# Patient Record
Sex: Female | Born: 1955 | Race: White | Hispanic: No | Marital: Single
Health system: Southern US, Community
[De-identification: ages and names within clinical notes are randomized; demographics above are authoritative.]

## PROBLEM LIST (undated history)

## (undated) DIAGNOSIS — K449 Diaphragmatic hernia without obstruction or gangrene: Secondary | ICD-10-CM

## (undated) DIAGNOSIS — K219 Gastro-esophageal reflux disease without esophagitis: Secondary | ICD-10-CM

## (undated) DIAGNOSIS — I10 Essential (primary) hypertension: Secondary | ICD-10-CM

## (undated) DIAGNOSIS — K589 Irritable bowel syndrome without diarrhea: Secondary | ICD-10-CM

---

## 1998-12-26 ENCOUNTER — Ambulatory Visit (HOSPITAL_COMMUNITY): Admission: RE | Admit: 1998-12-26 | Discharge: 1998-12-26 | Payer: Self-pay | Admitting: Family Medicine

## 1998-12-26 ENCOUNTER — Encounter: Payer: Self-pay | Admitting: Family Medicine

## 2003-07-01 ENCOUNTER — Ambulatory Visit (HOSPITAL_COMMUNITY): Admission: RE | Admit: 2003-07-01 | Discharge: 2003-07-01 | Payer: Self-pay | Admitting: Family Medicine

## 2003-07-01 ENCOUNTER — Encounter: Payer: Self-pay | Admitting: Family Medicine

## 2003-07-06 ENCOUNTER — Other Ambulatory Visit: Admission: RE | Admit: 2003-07-06 | Discharge: 2003-07-06 | Payer: Self-pay | Admitting: Obstetrics and Gynecology

## 2003-08-04 ENCOUNTER — Observation Stay (HOSPITAL_COMMUNITY): Admission: AD | Admit: 2003-08-04 | Discharge: 2003-08-04 | Payer: Self-pay | Admitting: Obstetrics and Gynecology

## 2003-08-04 ENCOUNTER — Encounter (INDEPENDENT_AMBULATORY_CARE_PROVIDER_SITE_OTHER): Payer: Self-pay

## 2004-06-25 ENCOUNTER — Encounter (INDEPENDENT_AMBULATORY_CARE_PROVIDER_SITE_OTHER): Payer: Self-pay | Admitting: Specialist

## 2004-06-25 ENCOUNTER — Ambulatory Visit (HOSPITAL_COMMUNITY): Admission: RE | Admit: 2004-06-25 | Discharge: 2004-06-25 | Payer: Self-pay | Admitting: Gastroenterology

## 2004-08-04 ENCOUNTER — Emergency Department (HOSPITAL_COMMUNITY): Admission: EM | Admit: 2004-08-04 | Discharge: 2004-08-04 | Payer: Self-pay | Admitting: Emergency Medicine

## 2004-12-19 ENCOUNTER — Other Ambulatory Visit: Admission: RE | Admit: 2004-12-19 | Discharge: 2004-12-19 | Payer: Self-pay | Admitting: Obstetrics and Gynecology

## 2006-09-10 ENCOUNTER — Other Ambulatory Visit: Admission: RE | Admit: 2006-09-10 | Discharge: 2006-09-10 | Payer: Self-pay | Admitting: Obstetrics and Gynecology

## 2009-03-21 ENCOUNTER — Inpatient Hospital Stay (HOSPITAL_COMMUNITY): Admission: EM | Admit: 2009-03-21 | Discharge: 2009-03-24 | Payer: Self-pay | Admitting: Emergency Medicine

## 2011-04-11 LAB — CLOSTRIDIUM DIFFICILE EIA
C difficile Toxins A+B, EIA: NEGATIVE
C difficile Toxins A+B, EIA: NEGATIVE

## 2011-04-11 LAB — BASIC METABOLIC PANEL
BUN: 10 mg/dL (ref 6–23)
BUN: 6 mg/dL (ref 6–23)
BUN: 9 mg/dL (ref 6–23)
Calcium: 8.1 mg/dL — ABNORMAL LOW (ref 8.4–10.5)
Calcium: 8.3 mg/dL — ABNORMAL LOW (ref 8.4–10.5)
Creatinine, Ser: 1.42 mg/dL — ABNORMAL HIGH (ref 0.4–1.2)
GFR calc Af Amer: 49 mL/min — ABNORMAL LOW (ref >60)
GFR calc non Af Amer: 39 mL/min — ABNORMAL LOW (ref >60)
GFR calc non Af Amer: 41 mL/min — ABNORMAL LOW (ref >60)
GFR calc non Af Amer: 44 mL/min — ABNORMAL LOW (ref >60)
Glucose, Bld: 92 mg/dL (ref 70–99)
Glucose, Bld: 92 mg/dL (ref 70–99)
Potassium: 3.6 mEq/L (ref 3.5–5.1)
Sodium: 137 mEq/L (ref 135–145)
Sodium: 142 mEq/L (ref 135–145)

## 2011-04-11 LAB — FECAL LACTOFERRIN, QUANT: Fecal Lactoferrin: POSITIVE

## 2011-04-11 LAB — COMPREHENSIVE METABOLIC PANEL
ALT: 23 U/L (ref 0–35)
AST: 19 U/L (ref 0–37)
Albumin: 2.4 g/dL — ABNORMAL LOW (ref 3.5–5.2)
Alkaline Phosphatase: 59 U/L (ref 39–117)
BUN: 17 mg/dL (ref 6–23)
CO2: 24 mEq/L (ref 19–32)
Calcium: 7.8 mg/dL — ABNORMAL LOW (ref 8.4–10.5)
Calcium: 8.6 mg/dL (ref 8.4–10.5)
GFR calc Af Amer: 32 mL/min — ABNORMAL LOW (ref >60)
GFR calc non Af Amer: 26 mL/min — ABNORMAL LOW (ref >60)
Potassium: 3.6 mEq/L (ref 3.5–5.1)
Sodium: 132 mEq/L — ABNORMAL LOW (ref 135–145)
Sodium: 137 mEq/L (ref 135–145)
Total Protein: 5.5 g/dL — ABNORMAL LOW (ref 6.0–8.3)

## 2011-04-11 LAB — PROTIME-INR: INR: 1.1 (ref 0.00–1.49)

## 2011-04-11 LAB — URINE MICROSCOPIC-ADD ON

## 2011-04-11 LAB — IRON AND TIBC
Iron: 77 ug/dL (ref 42–135)
TIBC: 292 ug/dL (ref 250–470)

## 2011-04-11 LAB — HEPATIC FUNCTION PANEL
ALT: 15 U/L (ref 0–35)
AST: 14 U/L (ref 0–37)
Albumin: 2.2 g/dL — ABNORMAL LOW (ref 3.5–5.2)
Alkaline Phosphatase: 59 U/L (ref 39–117)
Bilirubin, Direct: 0.1 mg/dL (ref 0.0–0.3)
Total Bilirubin: 0.5 mg/dL (ref 0.3–1.2)

## 2011-04-11 LAB — DIFFERENTIAL
Eosinophils Absolute: 0.3 10*3/uL (ref 0.0–0.7)
Eosinophils Relative: 3 % (ref 0–5)
Lymphs Abs: 2.8 10*3/uL (ref 0.7–4.0)
Monocytes Relative: 6 % (ref 3–12)

## 2011-04-11 LAB — RETICULOCYTES: Retic Count, Absolute: 18.7 10*3/uL — ABNORMAL LOW (ref 19.0–186.0)

## 2011-04-11 LAB — LIPASE, BLOOD: Lipase: 27 U/L (ref 11–59)

## 2011-04-11 LAB — POCT I-STAT, CHEM 8
Calcium, Ion: 0.95 mmol/L — ABNORMAL LOW (ref 1.12–1.32)
Glucose, Bld: 81 mg/dL (ref 70–99)
HCT: 31 % — ABNORMAL LOW (ref 36.0–46.0)
Hemoglobin: 10.5 g/dL — ABNORMAL LOW (ref 12.0–15.0)

## 2011-04-11 LAB — CBC
HCT: 28.1 % — ABNORMAL LOW (ref 36.0–46.0)
HCT: 28.4 % — ABNORMAL LOW (ref 36.0–46.0)
HCT: 29.9 % — ABNORMAL LOW (ref 36.0–46.0)
MCHC: 33.2 g/dL (ref 30.0–36.0)
MCHC: 33.4 g/dL (ref 30.0–36.0)
Platelets: 197 10*3/uL (ref 150–400)
Platelets: 202 10*3/uL (ref 150–400)
Platelets: 232 10*3/uL (ref 150–400)
RBC: 3.46 MIL/uL — ABNORMAL LOW (ref 3.87–5.11)
RBC: 4.34 MIL/uL (ref 3.87–5.11)
RDW: 15.6 % — ABNORMAL HIGH (ref 11.5–15.5)
RDW: 15.7 % — ABNORMAL HIGH (ref 11.5–15.5)
WBC: 11.4 10*3/uL — ABNORMAL HIGH (ref 4.0–10.5)
WBC: 5.2 10*3/uL (ref 4.0–10.5)

## 2011-04-11 LAB — URINALYSIS, ROUTINE W REFLEX MICROSCOPIC
Bilirubin Urine: NEGATIVE
Glucose, UA: NEGATIVE mg/dL
Ketones, ur: NEGATIVE mg/dL
pH: 6 (ref 5.0–8.0)

## 2011-04-11 LAB — FERRITIN: Ferritin: 51 ng/mL (ref 10–291)

## 2011-04-11 LAB — HEMOCCULT GUIAC POC 1CARD (OFFICE): Fecal Occult Bld: NEGATIVE

## 2011-04-11 LAB — URINE CULTURE: Colony Count: 10000

## 2011-04-11 LAB — TSH: TSH: 2.478 u[IU]/mL (ref 0.350–4.500)

## 2011-04-11 LAB — VITAMIN B12: Vitamin B-12: 311 pg/mL (ref 211–911)

## 2011-04-11 LAB — HEMOGLOBIN A1C: Mean Plasma Glucose: 137 mg/dL

## 2011-05-14 NOTE — Discharge Summary (Signed)
[name], [name]              ACCOUNT NO.:  [id number]   MEDICAL RECORD NO.:  [id number]          PATIENT TYPE:  INP   LOCATION:  1540                         FACILITY:  [facility]   PHYSICIAN:  Ramiro Harvest, MD    DATE OF BIRTH:  [birth year]   DATE OF ADMISSION:  03/20/2009  DATE OF DISCHARGE:  03/24/2009                               DISCHARGE SUMMARY   PRIMARY CARE PHYSICIAN:  Emeterio Reeve, MD   DISCHARGE DIAGNOSES:  1. acute gastroenteritis.  2. Acute renal failure.  3. Orthostasis.  4. Dehydration.  5. Anemia of chronic disease.  6. Irritable bowel syndrome.  7. Hypertension.  8. Allergic rhinitis.  9. Gastroesophageal reflux disease slash hiatal hernia.  10.Hepatic steatosis.  11.Hypokalemia.   DISCHARGE MEDICATIONS:  1. Nexium 40 mg p.o. daily  2. Singular 10 mg p.o. q.h.s.  3. Robinul forte 2 mg b.i.d.  4. Omnaris nasal spray 50 mcg as needed.  5. Xyzal 5 mg p.o. q.h.s.  6. Promethazine 25 mg p.o. t.i.d. p.r.n.  7. Ocella 3/0.03 mg p.o. daily.  8. Librax 1 tablet p.o. q.6-8 hours as needed.  9. Astepro 2 nasally as needed as previously taken.  10.Lisinopril HCTZ 20/25 mg p.o. daily, the patient is to start this 4      days post discharge.  11.Multivitamin 1 tablet daily.   DISPOSITION AND FOLLOW-UP:  The patient will be discharged home.  The  patient is to follow up with PCP in the next 1-2 weeks.  On follow-up a  basic metabolic profile needs to be checked to follow up on the  patient's renal function and electrolytes.  The patient also need a CBC  checked to follow up on her hemoglobin.   CONSULTATIONS DONE:  None.   PROCEDURES PERFORMED:  A CT of the abdomen and pelvis were performed on  March 20, 2009 which showed a small hiatal hernia, ill-defined, 2.5 cm  liver lesion in the setting of mild diffuse fatty infiltration,  scattered sigmoid diverticula without CT evidence of diverticulitis or  abscess.  Chest x-ray was done on March 21, 2009 that  showed no acute  disease.  An MRI of the abdomen was done on March 23, 2009; that showed  heterogeneous hepatic steatosis, focal fact superior to the gallbladder  accounts for the lesion demonstrated on prior CT.  There are no  suspicious liver lesions, diffusely decreased heterogeneous enhancement  of the right kidney with respect to the left.  Findings are most  compatible with pyelonephritis.  Correlation with urinalysis is  recommended.  No renal artery or vein occlusion is identified and there  is no hydronephrosis.   BRIEF ADMISSION HISTORY AND PHYSICAL:  [name] is a 55-year-  old female with a history allergic rhinitis and irritable bowel  syndrome.  The patient was at her baseline of health up until 3 days  prior to admission when she developed abdominal pains as well as severe  suprapubic abdominal.  Pain prior to this she was thought to have a  urinary tract infection and was originally started on ciprofloxacin.  For about a  week prior to admission the patient had been having  intermittent nausea and vomiting as well as diarrhea.  The patient  thinks the diarrhea first started before onset of the ciprofloxacin, but  got worse after initiation of the Cipro.  The patient nonetheless  completed the course with the help of some Phenergan, but continued not  to feel well. On the day of admission she presented to her PCP, Dr.  Laurine Blazer, for a checkup.  A CBC was obtained which showed an elevated  white count up to 12.7 at which point she was directly admitted.  She  was directed to the ED department to obtain a CT scan of her abdomen.  Electrolytes were drawn then and noted to have an elevated creatinine of  2.0.  On review of her records last creatinine in 2008 was 0.8.  The  patient denied any history of renal disease, but per family and herself  has had a hard time eating and drinking for the past few days.  The  patient had not eaten anything at all on the day of  admission and had  been having a lot of diarrhea.  The patient describes her irritable  bowel syndrome as sometimes occasional bouts of diarrhea and also  sometimes associated with nausea, so she thought it was all related to  this.  Patient did see Dr. Evette Cristal, who is a gastroenterologist, for her  irritable bowel syndrome who recommended for her to continue to take her  Robinul forte, but thought there might be something else going on given  the extent of her symptoms.   PHYSICAL EXAMINATION:  Per admitting physician, temperature 97.4, blood  pressure 111/56, pulse of 95, respirations 16, satting 99% on room air.  GENERAL:  Patient no acute distress, slightly obese female.  HEENT: Normocephalic, atraumatic.  Pupils equal, round and reactive to  light and accommodation.  Extraocular movements intact.  NECK:  Supple with no lymphadenopathy.  Dry mucous membranes, decreased  skin turgor.  RESPIRATORY:  Occasional mild crackles throughout but overall good air  movement.  No wheezes noted.  CARDIOVASCULAR:  Regular rate and rhythm.  No murmurs, rubs or gallops.  ABDOMEN:  Slightly obese, nontender, nondistended, positive bowel  sounds.  EXTREMITIES:  No clubbing, cyanosis or edema.  SKIN:  Clean, dry and intact.  NEUROLOGIC:  The patient was intact.  Patient was orthostatic.   ADMISSION LABS:  CBC:  White count 11.4, hemoglobin 10.5.  Sodium 135,  potassium 3.2, creatinine of 2.0, albumin of 2.9.  UA was showing 3-6  white blood cells, no bacteria, no nitrites, lipase was 27.  LFTs were  within normal limits except of albumin.  CT scan of the abdomen and  pelvis as stated above.   HOSPITAL COURSE:  1. Acute gastroenteritis.  The patient was admitted.  It was felt the      patient did have an acute gastroenteritis.  Stool cultures were      checked which were essentially and negative by the day of      admission.  C. diff cultures were also obtained which were negative      x2.  The  patient was placed on supportive care with IV fluids and      pain management.  CT scan was obtained as stated above.  CT scan      was concerning for possible liver lesions.  An MRI was obtained      which was essentially negative.  The patient  improved during the      hospitalization with improvement of her symptoms.  The patient's      nausea and vomiting had resolved by day of discharge.  The      patient's stools had been more formed and the patient was no longer      having any diarrhea.  The patient will be discharged home in stable      and improved condition.  The patient's white count also normalized      by day of discharge.  2. Acute renal failure.  The patient was noted to be in acute renal      failure on admission.  This was felt to be secondary to a prerenal      azotemia.  The patient was hydrated with IV fluids with improvement      of her renal function on a daily basis.  By day of discharge the      patient's creatinine was down to 1.29.  The patient will need to      follow up with PCP in 1-2 weeks.  Will get a BMET checked again to      follow up on her electrolytes and renal function.  The patient will      be discharged in stable and improved condition.  3. Hypokalemia.  The patient was noted to be hypokalemic on admission.      The patient's potassium was repleted and had resolved by day of      discharge.  4. Dehydration.  The patient was noted to be dehydrated on admission.      The patient was hydrated with IV fluids and the patient was      euvolemic by day of discharge.  5. Orthostasis.  The patient was noted to be orthostatic on admission      and during the hospitalization the patient was hydrated with IV      fluids.  The patient was asymptomatic by day of discharge and      patient will be discharged in stable and improved condition with      resolution of her orthostasis.  6. The rest of the patient's chronic medical issues were stable      throughout  the hospitalization. The patient will be discharged in      stable and improved condition.   On the day of discharge vital signs:  Temperature 98.7, blood pressure  122/78, pulse of 76, respirations 20, satting 100% on room air.   DISCHARGE LABS:  Sodium 142, potassium 3.8, chloride 116, bicarb 20, BUN  6, creatinine 1.29, glucose of 92 and a calcium of 8.3.  CBC with a  white count of 5.2, hemoglobin of 9.4, platelets of 202, hematocrit of  28.4.   It was a pleasure taking care of [name].      Ramiro Harvest, MD  Electronically Signed     DT/MEDQ  D:  03/24/2009  T:  03/24/2009  Job:  [id number]   cc:   Emeterio Reeve, MD  Fax: [phone]

## 2011-05-14 NOTE — H&P (Signed)
NAME:  [name], [name]              ACCOUNT NO.:  [id number]   MEDICAL RECORD NO.:  [id number]          PATIENT TYPE:  INP   LOCATION:  1540                         FACILITY:  [facility]   PHYSICIAN:  Michiel Cowboy, MDDATE OF BIRTH:  04/29/1956   DATE OF ADMISSION:  03/20/2009  DATE OF DISCHARGE:                              HISTORY & PHYSICAL   PRIMARY CARE Javari Bufkin:  Dr. Emeterio Reeve.   The patient is a 55 year old female with history of acute rhinitis and  irritable bowel syndrome.  The patient was at her baseline of health up  until about 3 days ago or so when she developed abdominal pains, as well  as severe suprapubic abdominal pain.  Prior to this, she was thought to  have a urinary tract infection was started ciprofloxacin.  For about a  week now or so also she has been having intermittent nausea and  vomiting, as well as diarrhea.  She thinks the diarrhea started first  before the Cipro, but then got worse after the initiation of Cipro.  The  patient nonetheless completed the course with the help of with some  Phenergan, but continued to not feel well.  Today, she presented to Dr.  Paulino Rily for a checkup.  Her CBC was obtained and showed an elevated  white blood cell count up to 12.7, at which point she was directed to  the emergency department to obtain a CT scan of her abdomen.  Here, her  electrolytes were drawn and were noted to have an elevated creatinine at  2.0.  In review of records, her last creatinine was in 2008, and was  0.8.  She herself denies any history of renal disease.  But per her  family and herself had a hard time eating and drinking for the past few  days, has not eaten anything at all today and has been having a lot of  diarrhea.   She describes her irritable bowel syndrome at sometimes occasional bouts  of diarrhea and also sometimes associated with nausea, so she thought it  was all related to this.  She did see Dr. Evette Cristal, who is her GI  physician  for her irritable bowel syndrome who recommended for her to continue to  take her Robinul Forte, but thought there was maybe something else going  on given the extent of her symptoms.   PAST MEDICAL HISTORY SIGNIFICANT:  1. History of irritable bowel syndrome.  2. Hypertension.  3. Allergic rhinitis.  4. GERD.  5. Hiatal hernia.   SOCIAL HISTORY:  The patient does not smoke, does not use drugs, does  not drink alcohol.  Lives at home with supportive family members.   FAMILY HISTORY:  Noncontributory.   ALLERGIES:  AMOXICILLIN and ELEMENTAL MERCURY.  The patient develops a  rash if she comes in contact with it.   MEDICATIONS:  1. Lisinopril 20/25 mg daily.  2. Nexium 40 mg daily.  3. Singulair 10 mg daily.  4. Robinul Forte 2 mg twice a day.  5. __________2.5 mg in the evening.  6. __________ 4 puffs each nostril once a  day.  7. Astelin 2 puffs each nostril twice a day.  8. Yasmin.  9. Multivitamins.  10.The patient was also supposedly given acetaminophen/codeine 1      tablet as needed every 4 hours for pain.   REVIEW OF SYSTEMS:  Significant for patient having generalized weakness  and at first had some mild shortness of breath which currently resolved,  which she more attributes to just kind of feeling tired and not well  overall.  No chest pain.  No fevers or chills.  No constipation, but  severe diarrhea.  Her last bowel movement was in the morning.  Otherwise, review of systems unremarkable, except for also a mild cough.   PHYSICAL EXAMINATION:  VITAL SIGNS:  Temperature 97.4, blood pressure  111/56, pulse 95, respirations 16.  Saturating 99% on room air.  GENERAL:  The patient appears to be in no acute distress, slightly obese  female.  HEENT:  Head nontraumatic.  Dry mucous membranes.  Decreased skin  turgor.  LUNGS:  Occasional mild crackles throughout, but overall good air  movement.  No wheezes noted.  HEART:  Regular rate and rhythm.  No  murmurs,  rubs or gallops.  SKIN:  Clean, dry and intact.  ABDOMEN:  Slightly obese, but nontender, nondistended.  LOWER EXTREMITIES:  Without clubbing, cyanosis or edema.  NEUROLOGIC:  Intact.  The patient is orthostatic.   LABORATORY DATA:  White blood cell count 11.4, hemoglobin 10.5, sodium  135, potassium 3.2, creatinine 2.0, albumin 2.9.  UA showing 3-6 white  blood cells, but no bacteria and no nitrites, lipase 27.  LFTs within  normal limits with exception of albumin.  CT scan of her abdomen and  pelvis showing a 2.5 cm liver lesion which can be characterized because  it is a non-contrast CT scan, fatty liver infiltration and sigmoid  diverticulosis, but no diverticulitis or inflammation, otherwise  unremarkable.   ASSESSMENT/PLAN:  This is a 55 year old female who presents with slight  dehydration, acute renal failure, diarrhea and nausea.   1. Acute renal failure, likely secondary to dehydration.  CT scan of      the abdomen and pelvis did not show any hydroureter or renal      obstruction.  We will give IV fluids for orthostatics.  Follow      creatinine.  Once the patient's creatinine improves and she stops      being orthostatic, expect discharge to home.  2. Nausea/diarrhea.  Differential includes gastroenteritis versus part      of irritable bowel syndrome.  This is given diarrhea and recent      antibiotic exposure.  C diff could not be completely excluded.  We      will obtain stool studies.  Give supportive therapy for now.  3. Low potassium.  Will replace.  4. Elevated white blood cell count.  This could be related to      diarrhea.  It could be also hemoconcentration.  Also given mild      cough, will obtain a chest x-ray to rule out an infectious process.  5. Irritable bowel syndrome.  Continue Robinul for now.  6. Allergies.  Continue Singulair.  7. Liver lesion.  There are a few approaches to this.  She may either      have a contrast CT scan once her renal function  stabilizes and      improves or will need an MRI to further evaluate this.  This could  be done as an      outpatient.  I doubt that this has much to do with her current      diagnosis.  8. Hypertension.  Hold lisinopril and hydrochlorothiazide.  9. Prophylaxis.  Protonix and Lovenox.      Michiel Cowboy, MD  Electronically Signed     AVD/MEDQ  D:  03/21/2009  T:  03/21/2009  Job:  [id number]   cc:   Emeterio Reeve, MD  Fax: [phone]

## 2011-05-17 NOTE — Op Note (Signed)
NAME:  [name], [name]                        ACCOUNT NO.:  [id number]   MEDICAL RECORD NO.:  [id number]                   PATIENT TYPE:  AMB   LOCATION:  ENDO                                 FACILITY:  [facility]   PHYSICIAN:  Graylin Shiver, M.D.                DATE OF BIRTH:  1956-06-02   DATE OF PROCEDURE:  06/25/2004  DATE OF DISCHARGE:                                 OPERATIVE REPORT   PROCEDURE:  Esophagogastroduodenoscopy with biopsy.   INDICATIONS FOR PROCEDURE:  This patient has a history of abdominal pain and  history of acid reflux.  Informed consent was obtained after explanation of  the risks of bleeding, infection and perforation.   PREMEDICATIONS:  Fentanyl 75 mcg IV, Versed 8 mg IV.   DESCRIPTION OF PROCEDURE:  With the patient in the left lateral decubitus  position, the Olympus gastroscope was inserted into the oropharynx and  passed into the esophagus.  It was advanced down the esophagus and into the  stomach and into the duodenum.  The second portion and bulb of the duodenum  looked normal.  The stomach had normal-appearing color to the mucosa. There  were, however, some scattered small polyps in the body of the stomach on  multiple biopsies of these small polyps were obtained for histological  inspection.  I suspect these are benign hypoplastic or cystic-type polyps.  No lesions were seen on the fundus or cardia on retroflexion.  A biopsy was  also obtained from the distal stomach for a CLO-test to look for evidence of  Helicobacter pylori.  The esophagus was then inspected.  The esophagogastric  junction was at 35 cm.  There was a small hiatal hernia.  No abnormalities  were noted in the esophagus.  She tolerated the procedure well without  complications.   IMPRESSION:  1. Scattered small gastric polyps.  Biopsies were obtained.  2. Small hiatal hernia.   PLAN:  The pathology will be checked.  I see nothing specifically on this  examination to explain the  patient's recent complaints of abdominal pain.                                               Graylin Shiver, M.D.    [name]  D:  06/25/2004  T:  06/25/2004  Job:  [id number]   cc:   Duncan Dull, M.D.  [address]  [place]  [place] [address]  Fax: [phone]

## 2011-05-17 NOTE — Op Note (Signed)
NAME:  [name], [name]                        ACCOUNT NO.:  [id number]   MEDICAL RECORD NO.:  [id number]                   PATIENT TYPE:  OBV   LOCATION:  9304                                 FACILITY:  [facility]   PHYSICIAN:  Zenaida Niece, M.D.             DATE OF BIRTH:  Jul 21, 1956   DATE OF PROCEDURE:  08/04/2003  DATE OF DISCHARGE:  08/04/2003                                 OPERATIVE REPORT   PREOPERATIVE DIAGNOSIS:  Left pelvic mass.   POSTOPERATIVE DIAGNOSIS:  Left ovarian endometrioma.   PROCEDURE:  Laparoscopic left salpingo-oophorectomy.   SURGEON:  Zenaida Niece, M.D.   ASSISTANT:  Malachi Pro. Ambrose Mantle, M.D.   ANESTHESIA:  General endotracheal tube.   ESTIMATED BLOOD LOSS:  50 mL.   FINDINGS:  The uterus was slightly enlarged with a small posterior myoma.  The right tube and ovary were normal, and upper abdomen was normal.  The  left ovary was enlarged and adherent to the posterior cul-de-sac.  A cyst  did rupture upon manipulation, which revealed chocolate-appearing fluid.   PROCEDURE IN DETAIL:  The patient was taken to the operating room and placed  in the dorsal supine position.  General anesthesia was induced, and she was  placed in mobile stirrups.  Abdomen, perineum, and vagina were prepped and  draped in the usual sterile fashion and a Foley catheter inserted and a  Hulka tenaculum applied to the cervix for uterine manipulation.  Infraumbilical skin was infiltrated with 0.25% Marcaine and a 3 cm  horizontal incision was made.  With some difficulty, I dissected down to the  fascia, which was elevated and entered sharply.  Peritoneum was then  identified and entered bluntly.  A pursestring suture of 0 Vicryl was placed  around the fascia and held for later use.  The Hasson cannula was inserted  and secured with the suture.  Inspection with the laparoscope revealed the  above-mentioned findings.  Five millimeter ports were placed on each side  under direct  visualization.  There were some omental adhesions to the  posterior cul-de-sac with the posterior left ovary.  Some of these were  taken down bluntly, but the ovary was not able to be completely freed up  with blunt dissection.  Using retraction and bipolar cautery, the  infundibulopelvic ligament was coagulated with bipolar cautery and  transected.  The fallopian tube was then freed also, coagulating the  mesosalpinx and freeing up the fallopian tube almost all the way to the  utero-ovarian pedicle.  The proximal fallopian tube and utero-ovarian  ligaments were coagulated with bipolar cautery and transected.  Bleeding  from the uterine side was controlled with electrocautery.  The ovary was  then stuck, again, to the posterior cul-de-sac and the posterior uterus.  These pedicles were coagulated with bipolar cautery and transected and the  ovary was freed from all attachments except for an adhesion to the omentum.  The  bed where the ovary was was inspected and some bleeding controlled with  electrocautery.  The ureter was identified but found to be inferior to this  bed.  This area was copiously irrigated as during some of the dissection, a  cyst ruptured on the uterus and revealed chocolate-covered fluid.  The  omental adhesion to the ovary was then coagulated with bipolar cautery and  freed sharply.  Some filmy omental adhesions in the posterior cul-de-sac  were taken down sharply.  The left infundibulopelvic ligament and the bed  where the ovary was were again irrigated, inspected, and found to be  hemostatic.  The 5 mm scope was then introduced through the left-sided port.  A large grasper was introduced through the umbilical port.  The freed-up  right tube and ovary were grasped and delivered to the umbilical incision.  Using Kelly clamps I was able to deliver the ovary through the incision.  Upon delivering it some more chocolate fluid escaped from this cyst.  The  umbilical trocar  was reintroduced and inspection with the laparoscope  revealed all sites to be hemostatic.  The 5 mm ports were removed under  direct visualization.  The umbilical trocar was removed and the previously-  placed pursestring suture was then tied with adequate reapproximation of the  fascia.  The umbilical incision was irrigated to remove any chocolate fluid  from the ovarian cyst.  All skin incisions were closed with subcuticular  sutures of 4-0 Vicryl, followed by Steri-Strips and Band-Aids.  The Hulka  tenaculum and Foley catheter were removed.  The patient was awakened in the  operating room, tolerated the procedure well, and was taken to the recovery  room in stable condition.  Counts were correct, and she was given no  antibiotics.                                               Zenaida Niece, M.D.    TDM/MEDQ  D:  08/04/2003  T:  08/04/2003  Job:  [id number]

## 2011-05-17 NOTE — Op Note (Signed)
NAME:  [name], [name]                        ACCOUNT NO.:  [id number]   MEDICAL RECORD NO.:  [id number]                   PATIENT TYPE:  AMB   LOCATION:  ENDO                                 FACILITY:  [facility]   PHYSICIAN:  Graylin Shiver, M.D.                DATE OF BIRTH:  Dec 23, 1956   DATE OF PROCEDURE:  06/25/2004  DATE OF DISCHARGE:                                 OPERATIVE REPORT   PROCEDURE:  Colonoscopy.   INDICATIONS FOR PROCEDURE:  Recent complaints of abdominal pain, history of  irritable bowel syndrome, occasional rectal bleeding.   Informed consent was obtained after explanation of the risks of bleeding,  infection and perforation.   PREMEDICATION:  The procedure was done immediately after an EGD with an  additional 50 mcg of Fentanyl and 4 mg of Versed given.   DESCRIPTION OF PROCEDURE:  With the patient in the left lateral decubitus  position, a rectal exam was performed, no masses were felt. The Olympus  colonoscope was inserted into the rectum and advanced around the colon to  the cecum.  Cecal landmarks were identified. The cecum and ascending colon  were normal, the transverse colon normal. The descending colon, sigmoid and  rectum were normal.  She tolerated the procedure well without complications.   IMPRESSION:  Normal colonoscopy to the cecum.                                               Graylin Shiver, M.D.    Germain Osgood  D:  06/25/2004  T:  06/25/2004  Job:  [id number]   cc:   Duncan Dull, M.D.  [address]  [place]  [place] [address]  Fax: [phone]

## 2011-05-17 NOTE — H&P (Signed)
NAME:  [name], [name]                        ACCOUNT NO.:  [id number]   MEDICAL RECORD NO.:  [id number]                   PATIENT TYPE:  OBV   LOCATION:  NA                                   FACILITY:  [facility]   PHYSICIAN:  Zenaida Niece, M.D.             DATE OF BIRTH:  June 10, 1956   DATE OF ADMISSION:  08/04/2003  DATE OF DISCHARGE:                                HISTORY & PHYSICAL   CHIEF COMPLAINT:  Left pelvic mass.   HISTORY OF PRESENT ILLNESS:  This is a 55 year old white female, gravida 0,  who was referred to me by Duncan Dull, M.D., for abnormal bleeding and an  abnormal ultrasound.  She previously had regular menses with severe cramps  for which Naprosyn helped until approximately three to four months ago where  she skipped a month and then has had irregular bleeding since then.  She has  had a little bit of low back pain, but no pelvic pain and a few hot flashes.  She had a pelvic ultrasound performed on July 01, 2003, which revealed a 1.2  cm endometrium with a small myoma and a 7 cm left pelvic mass.  She is not  currently sexually active.  On exam in the office, she has an endometrial  biopsy performed, which revealed benign disordered proliferative  endometrium.  The uterus was slightly enlarged.  Her pelvic exam was  slightly difficult due to a weight of 240 pounds, but no specific masses  were palpated.  Options have been discussed with the patient and she wishes  to proceed with laparoscopic removal of this level pelvic mass.   PAST MEDICAL HISTORY:  Significant for:  1. Hypertension.  2. Allergies.  3. Gastrointestinal reflux disease.  4. Irritable bowel syndrome.   PAST SURGICAL HISTORY:  None.   ALLERGIES:  AMOXICILLIN.   CURRENT MEDICATIONS:  HCTZ, Prevacid, Zyrtec, and steroid nasal spray.   GYNECOLOGIC HISTORY:  No history of abnormal Pap smears with her last  previous Pap smear in 2002.   FAMILY HISTORY:  No GYN or colon cancer.   SOCIAL  HISTORY:  She is single and denies alcohol, tobacco, or drug use.   REVIEW OF SYSTEMS:  She does have some trouble emptying her bladder  recently, but this is without negative.   PHYSICAL EXAMINATION:  WEIGHT:  240 pounds.  VITAL SIGNS:  Blood pressure 140/90, pulse 80.  GENERAL APPEARANCE:  This is a slightly obese white female in no acute  distress.  NECK:  Supple without lymphadenopathy or thyromegaly.  LUNGS:  Clear to auscultation.  HEART:  Regular rate and rhythm without murmur.  ABDOMEN:  Slightly obese, soft, and nontender without palpable masses.  PELVIC:  External genitalia reveals no lesions.  On speculum exam, she has a  normal cervix, and the Pipelle sounded to 9-10 cm.  On bimanual exam, it is  difficult to palpate anything due her  body habitus.  She is nontender.  Rectovaginal exam confirms this.  EXTREMITIES:  Nontender.  No edema.   ASSESSMENT:  Left pelvic mass by ultrasound that is asymptomatic, except for  some recent abnormal bleeding.  The patient has had extensive counseling as  far as surgical options.  She has elected to proceed with laparoscopic  removal of this left adnexal mass only.  The risks of surgery, including  bleeding, infection, damage to bowels, bladder, and ureters, have been  discussed with the patient.   PLAN:  Admit the patient with a laparoscopy with left salpingo-oophorectomy  and possible adhesiolysis.  The patient also understands there is a  possibility for a laparotomy.                                               Zenaida Niece, M.D.    TDM/MEDQ  D:  08/03/2003  T:  08/03/2003  Job:  [id number]

## 2014-03-23 ENCOUNTER — Other Ambulatory Visit: Payer: Self-pay | Admitting: Obstetrics and Gynecology

## 2014-03-23 DIAGNOSIS — R928 Other abnormal and inconclusive findings on diagnostic imaging of breast: Secondary | ICD-10-CM

## 2014-03-31 ENCOUNTER — Ambulatory Visit
Admission: RE | Admit: 2014-03-31 | Discharge: 2014-03-31 | Disposition: A | Discharge status: Home / Self Care | Payer: 59 | Source: Ambulatory Visit | Attending: Obstetrics and Gynecology | Admitting: Obstetrics and Gynecology

## 2014-03-31 DIAGNOSIS — R928 Other abnormal and inconclusive findings on diagnostic imaging of breast: Secondary | ICD-10-CM

## 2014-10-17 ENCOUNTER — Other Ambulatory Visit: Payer: Self-pay | Admitting: Obstetrics and Gynecology

## 2014-10-17 DIAGNOSIS — R921 Mammographic calcification found on diagnostic imaging of breast: Secondary | ICD-10-CM

## 2014-10-26 ENCOUNTER — Encounter (INDEPENDENT_AMBULATORY_CARE_PROVIDER_SITE_OTHER): Payer: Self-pay

## 2014-10-26 ENCOUNTER — Ambulatory Visit
Admission: RE | Admit: 2014-10-26 | Discharge: 2014-10-26 | Disposition: A | Discharge status: Home / Self Care | Payer: 59 | Source: Ambulatory Visit | Attending: Obstetrics and Gynecology | Admitting: Obstetrics and Gynecology

## 2014-10-26 DIAGNOSIS — R921 Mammographic calcification found on diagnostic imaging of breast: Secondary | ICD-10-CM

## 2015-05-22 ENCOUNTER — Other Ambulatory Visit: Payer: Self-pay | Admitting: Gastroenterology

## 2016-05-03 ENCOUNTER — Other Ambulatory Visit: Payer: Self-pay | Admitting: Obstetrics and Gynecology

## 2016-05-03 DIAGNOSIS — N63 Unspecified lump in unspecified breast: Secondary | ICD-10-CM

## 2016-05-03 DIAGNOSIS — R921 Mammographic calcification found on diagnostic imaging of breast: Secondary | ICD-10-CM

## 2016-05-09 ENCOUNTER — Ambulatory Visit
Admission: RE | Admit: 2016-05-09 | Discharge: 2016-05-09 | Disposition: A | Discharge status: Home / Self Care | Payer: 59 | Source: Ambulatory Visit | Attending: Obstetrics and Gynecology | Admitting: Obstetrics and Gynecology

## 2016-05-09 DIAGNOSIS — R921 Mammographic calcification found on diagnostic imaging of breast: Secondary | ICD-10-CM

## 2016-12-30 DIAGNOSIS — B029 Zoster without complications: Secondary | ICD-10-CM | POA: Diagnosis not present

## 2017-01-08 DIAGNOSIS — J3089 Other allergic rhinitis: Secondary | ICD-10-CM | POA: Diagnosis not present

## 2017-01-08 DIAGNOSIS — J3081 Allergic rhinitis due to animal (cat) (dog) hair and dander: Secondary | ICD-10-CM | POA: Diagnosis not present

## 2017-01-08 DIAGNOSIS — J301 Allergic rhinitis due to pollen: Secondary | ICD-10-CM | POA: Diagnosis not present

## 2017-01-29 DIAGNOSIS — J301 Allergic rhinitis due to pollen: Secondary | ICD-10-CM | POA: Diagnosis not present

## 2017-01-29 DIAGNOSIS — J3089 Other allergic rhinitis: Secondary | ICD-10-CM | POA: Diagnosis not present

## 2017-01-29 DIAGNOSIS — J3081 Allergic rhinitis due to animal (cat) (dog) hair and dander: Secondary | ICD-10-CM | POA: Diagnosis not present

## 2017-02-05 DIAGNOSIS — J301 Allergic rhinitis due to pollen: Secondary | ICD-10-CM | POA: Diagnosis not present

## 2017-02-05 DIAGNOSIS — J3081 Allergic rhinitis due to animal (cat) (dog) hair and dander: Secondary | ICD-10-CM | POA: Diagnosis not present

## 2017-02-05 DIAGNOSIS — J3089 Other allergic rhinitis: Secondary | ICD-10-CM | POA: Diagnosis not present

## 2017-02-12 DIAGNOSIS — J301 Allergic rhinitis due to pollen: Secondary | ICD-10-CM | POA: Diagnosis not present

## 2017-02-12 DIAGNOSIS — J3081 Allergic rhinitis due to animal (cat) (dog) hair and dander: Secondary | ICD-10-CM | POA: Diagnosis not present

## 2017-02-12 DIAGNOSIS — J3089 Other allergic rhinitis: Secondary | ICD-10-CM | POA: Diagnosis not present

## 2017-02-26 DIAGNOSIS — J3089 Other allergic rhinitis: Secondary | ICD-10-CM | POA: Diagnosis not present

## 2017-02-26 DIAGNOSIS — J301 Allergic rhinitis due to pollen: Secondary | ICD-10-CM | POA: Diagnosis not present

## 2017-02-26 DIAGNOSIS — J3081 Allergic rhinitis due to animal (cat) (dog) hair and dander: Secondary | ICD-10-CM | POA: Diagnosis not present

## 2017-03-12 DIAGNOSIS — J3081 Allergic rhinitis due to animal (cat) (dog) hair and dander: Secondary | ICD-10-CM | POA: Diagnosis not present

## 2017-03-12 DIAGNOSIS — J301 Allergic rhinitis due to pollen: Secondary | ICD-10-CM | POA: Diagnosis not present

## 2017-03-12 DIAGNOSIS — J3089 Other allergic rhinitis: Secondary | ICD-10-CM | POA: Diagnosis not present

## 2017-03-19 DIAGNOSIS — J3089 Other allergic rhinitis: Secondary | ICD-10-CM | POA: Diagnosis not present

## 2017-03-19 DIAGNOSIS — J3081 Allergic rhinitis due to animal (cat) (dog) hair and dander: Secondary | ICD-10-CM | POA: Diagnosis not present

## 2017-03-19 DIAGNOSIS — J301 Allergic rhinitis due to pollen: Secondary | ICD-10-CM | POA: Diagnosis not present

## 2017-04-09 DIAGNOSIS — J301 Allergic rhinitis due to pollen: Secondary | ICD-10-CM | POA: Diagnosis not present

## 2017-04-09 DIAGNOSIS — J3089 Other allergic rhinitis: Secondary | ICD-10-CM | POA: Diagnosis not present

## 2017-04-09 DIAGNOSIS — J3081 Allergic rhinitis due to animal (cat) (dog) hair and dander: Secondary | ICD-10-CM | POA: Diagnosis not present

## 2017-04-16 DIAGNOSIS — J3081 Allergic rhinitis due to animal (cat) (dog) hair and dander: Secondary | ICD-10-CM | POA: Diagnosis not present

## 2017-04-16 DIAGNOSIS — J301 Allergic rhinitis due to pollen: Secondary | ICD-10-CM | POA: Diagnosis not present

## 2017-04-16 DIAGNOSIS — J3089 Other allergic rhinitis: Secondary | ICD-10-CM | POA: Diagnosis not present

## 2017-04-23 DIAGNOSIS — J3081 Allergic rhinitis due to animal (cat) (dog) hair and dander: Secondary | ICD-10-CM | POA: Diagnosis not present

## 2017-04-23 DIAGNOSIS — J3089 Other allergic rhinitis: Secondary | ICD-10-CM | POA: Diagnosis not present

## 2017-04-23 DIAGNOSIS — J301 Allergic rhinitis due to pollen: Secondary | ICD-10-CM | POA: Diagnosis not present

## 2017-04-30 DIAGNOSIS — H6091 Unspecified otitis externa, right ear: Secondary | ICD-10-CM | POA: Diagnosis not present

## 2017-05-07 DIAGNOSIS — J301 Allergic rhinitis due to pollen: Secondary | ICD-10-CM | POA: Diagnosis not present

## 2017-05-07 DIAGNOSIS — J3089 Other allergic rhinitis: Secondary | ICD-10-CM | POA: Diagnosis not present

## 2017-05-07 DIAGNOSIS — J3081 Allergic rhinitis due to animal (cat) (dog) hair and dander: Secondary | ICD-10-CM | POA: Diagnosis not present

## 2017-05-14 DIAGNOSIS — J301 Allergic rhinitis due to pollen: Secondary | ICD-10-CM | POA: Diagnosis not present

## 2017-05-14 DIAGNOSIS — J3081 Allergic rhinitis due to animal (cat) (dog) hair and dander: Secondary | ICD-10-CM | POA: Diagnosis not present

## 2017-05-14 DIAGNOSIS — J3089 Other allergic rhinitis: Secondary | ICD-10-CM | POA: Diagnosis not present

## 2017-05-21 DIAGNOSIS — J3089 Other allergic rhinitis: Secondary | ICD-10-CM | POA: Diagnosis not present

## 2017-05-21 DIAGNOSIS — J301 Allergic rhinitis due to pollen: Secondary | ICD-10-CM | POA: Diagnosis not present

## 2017-05-21 DIAGNOSIS — J3081 Allergic rhinitis due to animal (cat) (dog) hair and dander: Secondary | ICD-10-CM | POA: Diagnosis not present

## 2017-05-28 DIAGNOSIS — J3081 Allergic rhinitis due to animal (cat) (dog) hair and dander: Secondary | ICD-10-CM | POA: Diagnosis not present

## 2017-05-28 DIAGNOSIS — J301 Allergic rhinitis due to pollen: Secondary | ICD-10-CM | POA: Diagnosis not present

## 2017-05-28 DIAGNOSIS — J3089 Other allergic rhinitis: Secondary | ICD-10-CM | POA: Diagnosis not present

## 2017-06-04 DIAGNOSIS — J301 Allergic rhinitis due to pollen: Secondary | ICD-10-CM | POA: Diagnosis not present

## 2017-06-04 DIAGNOSIS — J3089 Other allergic rhinitis: Secondary | ICD-10-CM | POA: Diagnosis not present

## 2017-06-04 DIAGNOSIS — J3081 Allergic rhinitis due to animal (cat) (dog) hair and dander: Secondary | ICD-10-CM | POA: Diagnosis not present

## 2017-06-11 DIAGNOSIS — J3081 Allergic rhinitis due to animal (cat) (dog) hair and dander: Secondary | ICD-10-CM | POA: Diagnosis not present

## 2017-06-11 DIAGNOSIS — J3089 Other allergic rhinitis: Secondary | ICD-10-CM | POA: Diagnosis not present

## 2017-06-11 DIAGNOSIS — J301 Allergic rhinitis due to pollen: Secondary | ICD-10-CM | POA: Diagnosis not present

## 2017-06-25 DIAGNOSIS — J301 Allergic rhinitis due to pollen: Secondary | ICD-10-CM | POA: Diagnosis not present

## 2017-06-25 DIAGNOSIS — J3081 Allergic rhinitis due to animal (cat) (dog) hair and dander: Secondary | ICD-10-CM | POA: Diagnosis not present

## 2017-06-25 DIAGNOSIS — J3089 Other allergic rhinitis: Secondary | ICD-10-CM | POA: Diagnosis not present

## 2017-07-09 DIAGNOSIS — J301 Allergic rhinitis due to pollen: Secondary | ICD-10-CM | POA: Diagnosis not present

## 2017-07-09 DIAGNOSIS — J3089 Other allergic rhinitis: Secondary | ICD-10-CM | POA: Diagnosis not present

## 2017-07-09 DIAGNOSIS — J3081 Allergic rhinitis due to animal (cat) (dog) hair and dander: Secondary | ICD-10-CM | POA: Diagnosis not present

## 2017-07-16 DIAGNOSIS — J3089 Other allergic rhinitis: Secondary | ICD-10-CM | POA: Diagnosis not present

## 2017-07-16 DIAGNOSIS — J3081 Allergic rhinitis due to animal (cat) (dog) hair and dander: Secondary | ICD-10-CM | POA: Diagnosis not present

## 2017-07-16 DIAGNOSIS — J301 Allergic rhinitis due to pollen: Secondary | ICD-10-CM | POA: Diagnosis not present

## 2017-07-23 DIAGNOSIS — J3089 Other allergic rhinitis: Secondary | ICD-10-CM | POA: Diagnosis not present

## 2017-07-23 DIAGNOSIS — J3081 Allergic rhinitis due to animal (cat) (dog) hair and dander: Secondary | ICD-10-CM | POA: Diagnosis not present

## 2017-07-23 DIAGNOSIS — J301 Allergic rhinitis due to pollen: Secondary | ICD-10-CM | POA: Diagnosis not present

## 2017-08-13 DIAGNOSIS — J3081 Allergic rhinitis due to animal (cat) (dog) hair and dander: Secondary | ICD-10-CM | POA: Diagnosis not present

## 2017-08-13 DIAGNOSIS — J3089 Other allergic rhinitis: Secondary | ICD-10-CM | POA: Diagnosis not present

## 2017-08-13 DIAGNOSIS — J301 Allergic rhinitis due to pollen: Secondary | ICD-10-CM | POA: Diagnosis not present

## 2017-08-15 DIAGNOSIS — J3081 Allergic rhinitis due to animal (cat) (dog) hair and dander: Secondary | ICD-10-CM | POA: Diagnosis not present

## 2017-08-15 DIAGNOSIS — J3089 Other allergic rhinitis: Secondary | ICD-10-CM | POA: Diagnosis not present

## 2017-08-20 DIAGNOSIS — J3081 Allergic rhinitis due to animal (cat) (dog) hair and dander: Secondary | ICD-10-CM | POA: Diagnosis not present

## 2017-08-20 DIAGNOSIS — J301 Allergic rhinitis due to pollen: Secondary | ICD-10-CM | POA: Diagnosis not present

## 2017-08-20 DIAGNOSIS — J3089 Other allergic rhinitis: Secondary | ICD-10-CM | POA: Diagnosis not present

## 2017-08-27 DIAGNOSIS — J3089 Other allergic rhinitis: Secondary | ICD-10-CM | POA: Diagnosis not present

## 2017-08-27 DIAGNOSIS — J3081 Allergic rhinitis due to animal (cat) (dog) hair and dander: Secondary | ICD-10-CM | POA: Diagnosis not present

## 2017-08-27 DIAGNOSIS — J301 Allergic rhinitis due to pollen: Secondary | ICD-10-CM | POA: Diagnosis not present

## 2017-08-28 DIAGNOSIS — Z79899 Other long term (current) drug therapy: Secondary | ICD-10-CM | POA: Diagnosis not present

## 2017-08-28 DIAGNOSIS — Z1159 Encounter for screening for other viral diseases: Secondary | ICD-10-CM | POA: Diagnosis not present

## 2017-08-28 DIAGNOSIS — E7800 Pure hypercholesterolemia, unspecified: Secondary | ICD-10-CM | POA: Diagnosis not present

## 2017-08-28 DIAGNOSIS — E119 Type 2 diabetes mellitus without complications: Secondary | ICD-10-CM | POA: Diagnosis not present

## 2017-09-02 DIAGNOSIS — E119 Type 2 diabetes mellitus without complications: Secondary | ICD-10-CM | POA: Diagnosis not present

## 2017-09-02 DIAGNOSIS — Z0000 Encounter for general adult medical examination without abnormal findings: Secondary | ICD-10-CM | POA: Diagnosis not present

## 2017-09-02 DIAGNOSIS — Z23 Encounter for immunization: Secondary | ICD-10-CM | POA: Diagnosis not present

## 2017-09-10 DIAGNOSIS — J301 Allergic rhinitis due to pollen: Secondary | ICD-10-CM | POA: Diagnosis not present

## 2017-09-10 DIAGNOSIS — J3089 Other allergic rhinitis: Secondary | ICD-10-CM | POA: Diagnosis not present

## 2017-09-10 DIAGNOSIS — J3081 Allergic rhinitis due to animal (cat) (dog) hair and dander: Secondary | ICD-10-CM | POA: Diagnosis not present

## 2017-09-11 ENCOUNTER — Other Ambulatory Visit: Payer: Self-pay | Admitting: Obstetrics and Gynecology

## 2017-09-11 DIAGNOSIS — N951 Menopausal and female climacteric states: Secondary | ICD-10-CM | POA: Diagnosis not present

## 2017-09-11 DIAGNOSIS — Z01419 Encounter for gynecological examination (general) (routine) without abnormal findings: Secondary | ICD-10-CM | POA: Diagnosis not present

## 2017-09-11 DIAGNOSIS — Z1231 Encounter for screening mammogram for malignant neoplasm of breast: Secondary | ICD-10-CM

## 2017-09-16 DIAGNOSIS — J301 Allergic rhinitis due to pollen: Secondary | ICD-10-CM | POA: Diagnosis not present

## 2017-09-16 DIAGNOSIS — J3089 Other allergic rhinitis: Secondary | ICD-10-CM | POA: Diagnosis not present

## 2017-09-16 DIAGNOSIS — J3081 Allergic rhinitis due to animal (cat) (dog) hair and dander: Secondary | ICD-10-CM | POA: Diagnosis not present

## 2017-09-16 DIAGNOSIS — R05 Cough: Secondary | ICD-10-CM | POA: Diagnosis not present

## 2017-09-17 ENCOUNTER — Ambulatory Visit
Admission: RE | Admit: 2017-09-17 | Discharge: 2017-09-17 | Disposition: A | Discharge status: Home / Self Care | Payer: 59 | Source: Ambulatory Visit | Attending: Obstetrics and Gynecology | Admitting: Obstetrics and Gynecology

## 2017-09-17 DIAGNOSIS — J3089 Other allergic rhinitis: Secondary | ICD-10-CM | POA: Diagnosis not present

## 2017-09-17 DIAGNOSIS — Z1231 Encounter for screening mammogram for malignant neoplasm of breast: Secondary | ICD-10-CM | POA: Diagnosis not present

## 2017-09-17 DIAGNOSIS — J301 Allergic rhinitis due to pollen: Secondary | ICD-10-CM | POA: Diagnosis not present

## 2017-09-17 DIAGNOSIS — J3081 Allergic rhinitis due to animal (cat) (dog) hair and dander: Secondary | ICD-10-CM | POA: Diagnosis not present

## 2017-09-24 DIAGNOSIS — J3089 Other allergic rhinitis: Secondary | ICD-10-CM | POA: Diagnosis not present

## 2017-09-24 DIAGNOSIS — J301 Allergic rhinitis due to pollen: Secondary | ICD-10-CM | POA: Diagnosis not present

## 2017-09-24 DIAGNOSIS — J3081 Allergic rhinitis due to animal (cat) (dog) hair and dander: Secondary | ICD-10-CM | POA: Diagnosis not present

## 2017-09-25 DIAGNOSIS — Z23 Encounter for immunization: Secondary | ICD-10-CM | POA: Diagnosis not present

## 2017-10-01 DIAGNOSIS — J301 Allergic rhinitis due to pollen: Secondary | ICD-10-CM | POA: Diagnosis not present

## 2017-10-01 DIAGNOSIS — J3081 Allergic rhinitis due to animal (cat) (dog) hair and dander: Secondary | ICD-10-CM | POA: Diagnosis not present

## 2017-10-01 DIAGNOSIS — J3089 Other allergic rhinitis: Secondary | ICD-10-CM | POA: Diagnosis not present

## 2017-10-08 DIAGNOSIS — J3081 Allergic rhinitis due to animal (cat) (dog) hair and dander: Secondary | ICD-10-CM | POA: Diagnosis not present

## 2017-10-08 DIAGNOSIS — J3089 Other allergic rhinitis: Secondary | ICD-10-CM | POA: Diagnosis not present

## 2017-10-08 DIAGNOSIS — J301 Allergic rhinitis due to pollen: Secondary | ICD-10-CM | POA: Diagnosis not present

## 2017-10-15 DIAGNOSIS — J3081 Allergic rhinitis due to animal (cat) (dog) hair and dander: Secondary | ICD-10-CM | POA: Diagnosis not present

## 2017-10-15 DIAGNOSIS — J3089 Other allergic rhinitis: Secondary | ICD-10-CM | POA: Diagnosis not present

## 2017-10-15 DIAGNOSIS — J301 Allergic rhinitis due to pollen: Secondary | ICD-10-CM | POA: Diagnosis not present

## 2017-10-22 DIAGNOSIS — J301 Allergic rhinitis due to pollen: Secondary | ICD-10-CM | POA: Diagnosis not present

## 2017-10-22 DIAGNOSIS — J3081 Allergic rhinitis due to animal (cat) (dog) hair and dander: Secondary | ICD-10-CM | POA: Diagnosis not present

## 2017-10-22 DIAGNOSIS — J3089 Other allergic rhinitis: Secondary | ICD-10-CM | POA: Diagnosis not present

## 2017-10-23 DIAGNOSIS — J301 Allergic rhinitis due to pollen: Secondary | ICD-10-CM | POA: Diagnosis not present

## 2017-11-05 DIAGNOSIS — J301 Allergic rhinitis due to pollen: Secondary | ICD-10-CM | POA: Diagnosis not present

## 2017-11-05 DIAGNOSIS — J3089 Other allergic rhinitis: Secondary | ICD-10-CM | POA: Diagnosis not present

## 2017-11-05 DIAGNOSIS — J3081 Allergic rhinitis due to animal (cat) (dog) hair and dander: Secondary | ICD-10-CM | POA: Diagnosis not present

## 2017-11-12 DIAGNOSIS — J3081 Allergic rhinitis due to animal (cat) (dog) hair and dander: Secondary | ICD-10-CM | POA: Diagnosis not present

## 2017-11-12 DIAGNOSIS — J3089 Other allergic rhinitis: Secondary | ICD-10-CM | POA: Diagnosis not present

## 2017-11-12 DIAGNOSIS — J301 Allergic rhinitis due to pollen: Secondary | ICD-10-CM | POA: Diagnosis not present

## 2017-11-26 DIAGNOSIS — J3089 Other allergic rhinitis: Secondary | ICD-10-CM | POA: Diagnosis not present

## 2017-11-26 DIAGNOSIS — J301 Allergic rhinitis due to pollen: Secondary | ICD-10-CM | POA: Diagnosis not present

## 2017-11-26 DIAGNOSIS — J3081 Allergic rhinitis due to animal (cat) (dog) hair and dander: Secondary | ICD-10-CM | POA: Diagnosis not present

## 2017-12-03 DIAGNOSIS — J301 Allergic rhinitis due to pollen: Secondary | ICD-10-CM | POA: Diagnosis not present

## 2017-12-03 DIAGNOSIS — J3081 Allergic rhinitis due to animal (cat) (dog) hair and dander: Secondary | ICD-10-CM | POA: Diagnosis not present

## 2017-12-03 DIAGNOSIS — J3089 Other allergic rhinitis: Secondary | ICD-10-CM | POA: Diagnosis not present

## 2017-12-04 DIAGNOSIS — R12 Heartburn: Secondary | ICD-10-CM | POA: Diagnosis not present

## 2017-12-04 DIAGNOSIS — K589 Irritable bowel syndrome without diarrhea: Secondary | ICD-10-CM | POA: Diagnosis not present

## 2017-12-10 DIAGNOSIS — J3081 Allergic rhinitis due to animal (cat) (dog) hair and dander: Secondary | ICD-10-CM | POA: Diagnosis not present

## 2017-12-10 DIAGNOSIS — J3089 Other allergic rhinitis: Secondary | ICD-10-CM | POA: Diagnosis not present

## 2017-12-10 DIAGNOSIS — J301 Allergic rhinitis due to pollen: Secondary | ICD-10-CM | POA: Diagnosis not present

## 2017-12-17 DIAGNOSIS — J3081 Allergic rhinitis due to animal (cat) (dog) hair and dander: Secondary | ICD-10-CM | POA: Diagnosis not present

## 2017-12-17 DIAGNOSIS — J301 Allergic rhinitis due to pollen: Secondary | ICD-10-CM | POA: Diagnosis not present

## 2017-12-17 DIAGNOSIS — J3089 Other allergic rhinitis: Secondary | ICD-10-CM | POA: Diagnosis not present

## 2017-12-31 DIAGNOSIS — J3089 Other allergic rhinitis: Secondary | ICD-10-CM | POA: Diagnosis not present

## 2017-12-31 DIAGNOSIS — J301 Allergic rhinitis due to pollen: Secondary | ICD-10-CM | POA: Diagnosis not present

## 2018-01-14 DIAGNOSIS — J301 Allergic rhinitis due to pollen: Secondary | ICD-10-CM | POA: Diagnosis not present

## 2018-01-14 DIAGNOSIS — J3089 Other allergic rhinitis: Secondary | ICD-10-CM | POA: Diagnosis not present

## 2018-01-28 DIAGNOSIS — J3089 Other allergic rhinitis: Secondary | ICD-10-CM | POA: Diagnosis not present

## 2018-01-28 DIAGNOSIS — J3081 Allergic rhinitis due to animal (cat) (dog) hair and dander: Secondary | ICD-10-CM | POA: Diagnosis not present

## 2018-01-28 DIAGNOSIS — J301 Allergic rhinitis due to pollen: Secondary | ICD-10-CM | POA: Diagnosis not present

## 2018-02-04 DIAGNOSIS — J301 Allergic rhinitis due to pollen: Secondary | ICD-10-CM | POA: Diagnosis not present

## 2018-02-04 DIAGNOSIS — J3081 Allergic rhinitis due to animal (cat) (dog) hair and dander: Secondary | ICD-10-CM | POA: Diagnosis not present

## 2018-02-04 DIAGNOSIS — J3089 Other allergic rhinitis: Secondary | ICD-10-CM | POA: Diagnosis not present

## 2018-02-18 DIAGNOSIS — J301 Allergic rhinitis due to pollen: Secondary | ICD-10-CM | POA: Diagnosis not present

## 2018-02-18 DIAGNOSIS — J3081 Allergic rhinitis due to animal (cat) (dog) hair and dander: Secondary | ICD-10-CM | POA: Diagnosis not present

## 2018-02-18 DIAGNOSIS — J3089 Other allergic rhinitis: Secondary | ICD-10-CM | POA: Diagnosis not present

## 2018-02-25 DIAGNOSIS — J3081 Allergic rhinitis due to animal (cat) (dog) hair and dander: Secondary | ICD-10-CM | POA: Diagnosis not present

## 2018-02-25 DIAGNOSIS — J301 Allergic rhinitis due to pollen: Secondary | ICD-10-CM | POA: Diagnosis not present

## 2018-02-25 DIAGNOSIS — J3089 Other allergic rhinitis: Secondary | ICD-10-CM | POA: Diagnosis not present

## 2018-03-04 DIAGNOSIS — Z23 Encounter for immunization: Secondary | ICD-10-CM | POA: Diagnosis not present

## 2018-03-10 DIAGNOSIS — R05 Cough: Secondary | ICD-10-CM | POA: Diagnosis not present

## 2018-03-10 DIAGNOSIS — J3089 Other allergic rhinitis: Secondary | ICD-10-CM | POA: Diagnosis not present

## 2018-03-10 DIAGNOSIS — J0100 Acute maxillary sinusitis, unspecified: Secondary | ICD-10-CM | POA: Diagnosis not present

## 2018-03-10 DIAGNOSIS — J301 Allergic rhinitis due to pollen: Secondary | ICD-10-CM | POA: Diagnosis not present

## 2018-03-25 DIAGNOSIS — J3089 Other allergic rhinitis: Secondary | ICD-10-CM | POA: Diagnosis not present

## 2018-03-25 DIAGNOSIS — J3081 Allergic rhinitis due to animal (cat) (dog) hair and dander: Secondary | ICD-10-CM | POA: Diagnosis not present

## 2018-03-25 DIAGNOSIS — J301 Allergic rhinitis due to pollen: Secondary | ICD-10-CM | POA: Diagnosis not present

## 2018-04-01 DIAGNOSIS — J3089 Other allergic rhinitis: Secondary | ICD-10-CM | POA: Diagnosis not present

## 2018-04-01 DIAGNOSIS — J301 Allergic rhinitis due to pollen: Secondary | ICD-10-CM | POA: Diagnosis not present

## 2018-04-01 DIAGNOSIS — J3081 Allergic rhinitis due to animal (cat) (dog) hair and dander: Secondary | ICD-10-CM | POA: Diagnosis not present

## 2018-04-08 DIAGNOSIS — J3089 Other allergic rhinitis: Secondary | ICD-10-CM | POA: Diagnosis not present

## 2018-04-08 DIAGNOSIS — J301 Allergic rhinitis due to pollen: Secondary | ICD-10-CM | POA: Diagnosis not present

## 2018-04-08 DIAGNOSIS — J3081 Allergic rhinitis due to animal (cat) (dog) hair and dander: Secondary | ICD-10-CM | POA: Diagnosis not present

## 2018-04-15 DIAGNOSIS — J3081 Allergic rhinitis due to animal (cat) (dog) hair and dander: Secondary | ICD-10-CM | POA: Diagnosis not present

## 2018-04-15 DIAGNOSIS — J3089 Other allergic rhinitis: Secondary | ICD-10-CM | POA: Diagnosis not present

## 2018-04-15 DIAGNOSIS — J301 Allergic rhinitis due to pollen: Secondary | ICD-10-CM | POA: Diagnosis not present

## 2018-04-20 DIAGNOSIS — J3089 Other allergic rhinitis: Secondary | ICD-10-CM | POA: Diagnosis not present

## 2018-04-22 DIAGNOSIS — J3081 Allergic rhinitis due to animal (cat) (dog) hair and dander: Secondary | ICD-10-CM | POA: Diagnosis not present

## 2018-04-22 DIAGNOSIS — J301 Allergic rhinitis due to pollen: Secondary | ICD-10-CM | POA: Diagnosis not present

## 2018-04-22 DIAGNOSIS — J3089 Other allergic rhinitis: Secondary | ICD-10-CM | POA: Diagnosis not present

## 2018-04-29 DIAGNOSIS — J3081 Allergic rhinitis due to animal (cat) (dog) hair and dander: Secondary | ICD-10-CM | POA: Diagnosis not present

## 2018-04-29 DIAGNOSIS — J301 Allergic rhinitis due to pollen: Secondary | ICD-10-CM | POA: Diagnosis not present

## 2018-04-29 DIAGNOSIS — J3089 Other allergic rhinitis: Secondary | ICD-10-CM | POA: Diagnosis not present

## 2018-05-13 DIAGNOSIS — J3081 Allergic rhinitis due to animal (cat) (dog) hair and dander: Secondary | ICD-10-CM | POA: Diagnosis not present

## 2018-05-13 DIAGNOSIS — J301 Allergic rhinitis due to pollen: Secondary | ICD-10-CM | POA: Diagnosis not present

## 2018-05-13 DIAGNOSIS — J3089 Other allergic rhinitis: Secondary | ICD-10-CM | POA: Diagnosis not present

## 2018-05-20 DIAGNOSIS — J301 Allergic rhinitis due to pollen: Secondary | ICD-10-CM | POA: Diagnosis not present

## 2018-05-20 DIAGNOSIS — J3089 Other allergic rhinitis: Secondary | ICD-10-CM | POA: Diagnosis not present

## 2018-05-20 DIAGNOSIS — J3081 Allergic rhinitis due to animal (cat) (dog) hair and dander: Secondary | ICD-10-CM | POA: Diagnosis not present

## 2018-05-27 DIAGNOSIS — J3089 Other allergic rhinitis: Secondary | ICD-10-CM | POA: Diagnosis not present

## 2018-05-27 DIAGNOSIS — J301 Allergic rhinitis due to pollen: Secondary | ICD-10-CM | POA: Diagnosis not present

## 2018-05-27 DIAGNOSIS — J3081 Allergic rhinitis due to animal (cat) (dog) hair and dander: Secondary | ICD-10-CM | POA: Diagnosis not present

## 2018-06-03 DIAGNOSIS — J301 Allergic rhinitis due to pollen: Secondary | ICD-10-CM | POA: Diagnosis not present

## 2018-06-03 DIAGNOSIS — J3081 Allergic rhinitis due to animal (cat) (dog) hair and dander: Secondary | ICD-10-CM | POA: Diagnosis not present

## 2018-06-03 DIAGNOSIS — J3089 Other allergic rhinitis: Secondary | ICD-10-CM | POA: Diagnosis not present

## 2018-06-08 DIAGNOSIS — J301 Allergic rhinitis due to pollen: Secondary | ICD-10-CM | POA: Diagnosis not present

## 2018-06-10 DIAGNOSIS — J3089 Other allergic rhinitis: Secondary | ICD-10-CM | POA: Diagnosis not present

## 2018-06-10 DIAGNOSIS — J301 Allergic rhinitis due to pollen: Secondary | ICD-10-CM | POA: Diagnosis not present

## 2018-06-10 DIAGNOSIS — J3081 Allergic rhinitis due to animal (cat) (dog) hair and dander: Secondary | ICD-10-CM | POA: Diagnosis not present

## 2018-06-17 DIAGNOSIS — J3081 Allergic rhinitis due to animal (cat) (dog) hair and dander: Secondary | ICD-10-CM | POA: Diagnosis not present

## 2018-06-17 DIAGNOSIS — J3089 Other allergic rhinitis: Secondary | ICD-10-CM | POA: Diagnosis not present

## 2018-06-17 DIAGNOSIS — J301 Allergic rhinitis due to pollen: Secondary | ICD-10-CM | POA: Diagnosis not present

## 2018-06-24 DIAGNOSIS — J301 Allergic rhinitis due to pollen: Secondary | ICD-10-CM | POA: Diagnosis not present

## 2018-06-24 DIAGNOSIS — J3081 Allergic rhinitis due to animal (cat) (dog) hair and dander: Secondary | ICD-10-CM | POA: Diagnosis not present

## 2018-06-24 DIAGNOSIS — J3089 Other allergic rhinitis: Secondary | ICD-10-CM | POA: Diagnosis not present

## 2018-07-08 DIAGNOSIS — J301 Allergic rhinitis due to pollen: Secondary | ICD-10-CM | POA: Diagnosis not present

## 2018-07-08 DIAGNOSIS — J3081 Allergic rhinitis due to animal (cat) (dog) hair and dander: Secondary | ICD-10-CM | POA: Diagnosis not present

## 2018-07-08 DIAGNOSIS — J3089 Other allergic rhinitis: Secondary | ICD-10-CM | POA: Diagnosis not present

## 2018-07-15 DIAGNOSIS — J3089 Other allergic rhinitis: Secondary | ICD-10-CM | POA: Diagnosis not present

## 2018-07-15 DIAGNOSIS — J3081 Allergic rhinitis due to animal (cat) (dog) hair and dander: Secondary | ICD-10-CM | POA: Diagnosis not present

## 2018-07-15 DIAGNOSIS — J301 Allergic rhinitis due to pollen: Secondary | ICD-10-CM | POA: Diagnosis not present

## 2018-07-22 DIAGNOSIS — J3089 Other allergic rhinitis: Secondary | ICD-10-CM | POA: Diagnosis not present

## 2018-07-22 DIAGNOSIS — J301 Allergic rhinitis due to pollen: Secondary | ICD-10-CM | POA: Diagnosis not present

## 2018-07-22 DIAGNOSIS — J3081 Allergic rhinitis due to animal (cat) (dog) hair and dander: Secondary | ICD-10-CM | POA: Diagnosis not present

## 2018-07-29 DIAGNOSIS — J301 Allergic rhinitis due to pollen: Secondary | ICD-10-CM | POA: Diagnosis not present

## 2018-07-29 DIAGNOSIS — J3081 Allergic rhinitis due to animal (cat) (dog) hair and dander: Secondary | ICD-10-CM | POA: Diagnosis not present

## 2018-07-29 DIAGNOSIS — J3089 Other allergic rhinitis: Secondary | ICD-10-CM | POA: Diagnosis not present

## 2018-08-05 DIAGNOSIS — J3089 Other allergic rhinitis: Secondary | ICD-10-CM | POA: Diagnosis not present

## 2018-08-05 DIAGNOSIS — J301 Allergic rhinitis due to pollen: Secondary | ICD-10-CM | POA: Diagnosis not present

## 2018-08-05 DIAGNOSIS — J3081 Allergic rhinitis due to animal (cat) (dog) hair and dander: Secondary | ICD-10-CM | POA: Diagnosis not present

## 2018-08-12 DIAGNOSIS — J301 Allergic rhinitis due to pollen: Secondary | ICD-10-CM | POA: Diagnosis not present

## 2018-08-12 DIAGNOSIS — J3089 Other allergic rhinitis: Secondary | ICD-10-CM | POA: Diagnosis not present

## 2018-08-12 DIAGNOSIS — J3081 Allergic rhinitis due to animal (cat) (dog) hair and dander: Secondary | ICD-10-CM | POA: Diagnosis not present

## 2018-08-19 DIAGNOSIS — J3081 Allergic rhinitis due to animal (cat) (dog) hair and dander: Secondary | ICD-10-CM | POA: Diagnosis not present

## 2018-08-19 DIAGNOSIS — J301 Allergic rhinitis due to pollen: Secondary | ICD-10-CM | POA: Diagnosis not present

## 2018-08-19 DIAGNOSIS — J3089 Other allergic rhinitis: Secondary | ICD-10-CM | POA: Diagnosis not present

## 2018-08-26 DIAGNOSIS — J3081 Allergic rhinitis due to animal (cat) (dog) hair and dander: Secondary | ICD-10-CM | POA: Diagnosis not present

## 2018-08-26 DIAGNOSIS — J3089 Other allergic rhinitis: Secondary | ICD-10-CM | POA: Diagnosis not present

## 2018-08-26 DIAGNOSIS — J301 Allergic rhinitis due to pollen: Secondary | ICD-10-CM | POA: Diagnosis not present

## 2018-09-09 DIAGNOSIS — J3089 Other allergic rhinitis: Secondary | ICD-10-CM | POA: Diagnosis not present

## 2018-09-09 DIAGNOSIS — J3081 Allergic rhinitis due to animal (cat) (dog) hair and dander: Secondary | ICD-10-CM | POA: Diagnosis not present

## 2018-09-09 DIAGNOSIS — E119 Type 2 diabetes mellitus without complications: Secondary | ICD-10-CM | POA: Diagnosis not present

## 2018-09-09 DIAGNOSIS — J301 Allergic rhinitis due to pollen: Secondary | ICD-10-CM | POA: Diagnosis not present

## 2018-09-09 DIAGNOSIS — E7800 Pure hypercholesterolemia, unspecified: Secondary | ICD-10-CM | POA: Diagnosis not present

## 2018-09-09 DIAGNOSIS — Z0000 Encounter for general adult medical examination without abnormal findings: Secondary | ICD-10-CM | POA: Diagnosis not present

## 2018-09-09 DIAGNOSIS — Z79899 Other long term (current) drug therapy: Secondary | ICD-10-CM | POA: Diagnosis not present

## 2018-09-14 DIAGNOSIS — Z23 Encounter for immunization: Secondary | ICD-10-CM | POA: Diagnosis not present

## 2018-09-14 DIAGNOSIS — Z0000 Encounter for general adult medical examination without abnormal findings: Secondary | ICD-10-CM | POA: Diagnosis not present

## 2018-09-23 DIAGNOSIS — J3081 Allergic rhinitis due to animal (cat) (dog) hair and dander: Secondary | ICD-10-CM | POA: Diagnosis not present

## 2018-09-23 DIAGNOSIS — J301 Allergic rhinitis due to pollen: Secondary | ICD-10-CM | POA: Diagnosis not present

## 2018-09-23 DIAGNOSIS — J3089 Other allergic rhinitis: Secondary | ICD-10-CM | POA: Diagnosis not present

## 2018-09-25 DIAGNOSIS — J3089 Other allergic rhinitis: Secondary | ICD-10-CM | POA: Diagnosis not present

## 2018-09-25 DIAGNOSIS — J3081 Allergic rhinitis due to animal (cat) (dog) hair and dander: Secondary | ICD-10-CM | POA: Diagnosis not present

## 2018-09-30 ENCOUNTER — Other Ambulatory Visit: Payer: Self-pay | Admitting: Obstetrics and Gynecology

## 2018-09-30 DIAGNOSIS — J3089 Other allergic rhinitis: Secondary | ICD-10-CM | POA: Diagnosis not present

## 2018-09-30 DIAGNOSIS — Z1231 Encounter for screening mammogram for malignant neoplasm of breast: Secondary | ICD-10-CM

## 2018-09-30 DIAGNOSIS — J301 Allergic rhinitis due to pollen: Secondary | ICD-10-CM | POA: Diagnosis not present

## 2018-09-30 DIAGNOSIS — J3081 Allergic rhinitis due to animal (cat) (dog) hair and dander: Secondary | ICD-10-CM | POA: Diagnosis not present

## 2018-10-07 DIAGNOSIS — J3081 Allergic rhinitis due to animal (cat) (dog) hair and dander: Secondary | ICD-10-CM | POA: Diagnosis not present

## 2018-10-07 DIAGNOSIS — J3089 Other allergic rhinitis: Secondary | ICD-10-CM | POA: Diagnosis not present

## 2018-10-07 DIAGNOSIS — J301 Allergic rhinitis due to pollen: Secondary | ICD-10-CM | POA: Diagnosis not present

## 2018-10-13 DIAGNOSIS — Z1389 Encounter for screening for other disorder: Secondary | ICD-10-CM | POA: Diagnosis not present

## 2018-10-13 DIAGNOSIS — Z01419 Encounter for gynecological examination (general) (routine) without abnormal findings: Secondary | ICD-10-CM | POA: Diagnosis not present

## 2018-10-13 DIAGNOSIS — Z130 Encounter for screening for diseases of the blood and blood-forming organs and certain disorders involving the immune mechanism: Secondary | ICD-10-CM | POA: Diagnosis not present

## 2018-10-14 DIAGNOSIS — J301 Allergic rhinitis due to pollen: Secondary | ICD-10-CM | POA: Diagnosis not present

## 2018-10-14 DIAGNOSIS — J3081 Allergic rhinitis due to animal (cat) (dog) hair and dander: Secondary | ICD-10-CM | POA: Diagnosis not present

## 2018-10-14 DIAGNOSIS — J3089 Other allergic rhinitis: Secondary | ICD-10-CM | POA: Diagnosis not present

## 2018-10-21 DIAGNOSIS — J3081 Allergic rhinitis due to animal (cat) (dog) hair and dander: Secondary | ICD-10-CM | POA: Diagnosis not present

## 2018-10-21 DIAGNOSIS — J301 Allergic rhinitis due to pollen: Secondary | ICD-10-CM | POA: Diagnosis not present

## 2018-10-21 DIAGNOSIS — J3089 Other allergic rhinitis: Secondary | ICD-10-CM | POA: Diagnosis not present

## 2018-11-04 ENCOUNTER — Ambulatory Visit
Admission: RE | Admit: 2018-11-04 | Discharge: 2018-11-04 | Disposition: A | Discharge status: Home / Self Care | Payer: 59 | Source: Ambulatory Visit | Attending: Obstetrics and Gynecology | Admitting: Obstetrics and Gynecology

## 2018-11-04 DIAGNOSIS — Z1231 Encounter for screening mammogram for malignant neoplasm of breast: Secondary | ICD-10-CM | POA: Diagnosis not present

## 2018-11-04 DIAGNOSIS — J3081 Allergic rhinitis due to animal (cat) (dog) hair and dander: Secondary | ICD-10-CM | POA: Diagnosis not present

## 2018-11-04 DIAGNOSIS — J301 Allergic rhinitis due to pollen: Secondary | ICD-10-CM | POA: Diagnosis not present

## 2018-11-04 DIAGNOSIS — J3089 Other allergic rhinitis: Secondary | ICD-10-CM | POA: Diagnosis not present

## 2018-11-10 DIAGNOSIS — J3089 Other allergic rhinitis: Secondary | ICD-10-CM | POA: Diagnosis not present

## 2018-11-10 DIAGNOSIS — J301 Allergic rhinitis due to pollen: Secondary | ICD-10-CM | POA: Diagnosis not present

## 2018-11-10 DIAGNOSIS — R05 Cough: Secondary | ICD-10-CM | POA: Diagnosis not present

## 2018-11-10 DIAGNOSIS — J3081 Allergic rhinitis due to animal (cat) (dog) hair and dander: Secondary | ICD-10-CM | POA: Diagnosis not present

## 2018-11-11 DIAGNOSIS — J301 Allergic rhinitis due to pollen: Secondary | ICD-10-CM | POA: Diagnosis not present

## 2018-11-11 DIAGNOSIS — J3089 Other allergic rhinitis: Secondary | ICD-10-CM | POA: Diagnosis not present

## 2018-11-11 DIAGNOSIS — J3081 Allergic rhinitis due to animal (cat) (dog) hair and dander: Secondary | ICD-10-CM | POA: Diagnosis not present

## 2018-11-18 DIAGNOSIS — J301 Allergic rhinitis due to pollen: Secondary | ICD-10-CM | POA: Diagnosis not present

## 2018-11-18 DIAGNOSIS — J3081 Allergic rhinitis due to animal (cat) (dog) hair and dander: Secondary | ICD-10-CM | POA: Diagnosis not present

## 2018-11-18 DIAGNOSIS — J3089 Other allergic rhinitis: Secondary | ICD-10-CM | POA: Diagnosis not present

## 2018-12-04 DIAGNOSIS — R05 Cough: Secondary | ICD-10-CM | POA: Diagnosis not present

## 2018-12-04 DIAGNOSIS — J3081 Allergic rhinitis due to animal (cat) (dog) hair and dander: Secondary | ICD-10-CM | POA: Diagnosis not present

## 2018-12-04 DIAGNOSIS — J301 Allergic rhinitis due to pollen: Secondary | ICD-10-CM | POA: Diagnosis not present

## 2018-12-04 DIAGNOSIS — J3089 Other allergic rhinitis: Secondary | ICD-10-CM | POA: Diagnosis not present

## 2018-12-09 DIAGNOSIS — J3089 Other allergic rhinitis: Secondary | ICD-10-CM | POA: Diagnosis not present

## 2018-12-09 DIAGNOSIS — J301 Allergic rhinitis due to pollen: Secondary | ICD-10-CM | POA: Diagnosis not present

## 2018-12-09 DIAGNOSIS — J3081 Allergic rhinitis due to animal (cat) (dog) hair and dander: Secondary | ICD-10-CM | POA: Diagnosis not present

## 2018-12-16 DIAGNOSIS — J3089 Other allergic rhinitis: Secondary | ICD-10-CM | POA: Diagnosis not present

## 2018-12-16 DIAGNOSIS — J301 Allergic rhinitis due to pollen: Secondary | ICD-10-CM | POA: Diagnosis not present

## 2018-12-16 DIAGNOSIS — J3081 Allergic rhinitis due to animal (cat) (dog) hair and dander: Secondary | ICD-10-CM | POA: Diagnosis not present

## 2018-12-24 DIAGNOSIS — J301 Allergic rhinitis due to pollen: Secondary | ICD-10-CM | POA: Diagnosis not present

## 2019-01-06 DIAGNOSIS — J3081 Allergic rhinitis due to animal (cat) (dog) hair and dander: Secondary | ICD-10-CM | POA: Diagnosis not present

## 2019-01-06 DIAGNOSIS — J3089 Other allergic rhinitis: Secondary | ICD-10-CM | POA: Diagnosis not present

## 2019-01-06 DIAGNOSIS — J301 Allergic rhinitis due to pollen: Secondary | ICD-10-CM | POA: Diagnosis not present

## 2019-01-13 DIAGNOSIS — J3089 Other allergic rhinitis: Secondary | ICD-10-CM | POA: Diagnosis not present

## 2019-01-13 DIAGNOSIS — J301 Allergic rhinitis due to pollen: Secondary | ICD-10-CM | POA: Diagnosis not present

## 2019-01-13 DIAGNOSIS — J3081 Allergic rhinitis due to animal (cat) (dog) hair and dander: Secondary | ICD-10-CM | POA: Diagnosis not present

## 2019-02-01 DIAGNOSIS — R12 Heartburn: Secondary | ICD-10-CM | POA: Diagnosis not present

## 2019-02-01 DIAGNOSIS — K580 Irritable bowel syndrome with diarrhea: Secondary | ICD-10-CM | POA: Diagnosis not present

## 2019-02-03 DIAGNOSIS — J3081 Allergic rhinitis due to animal (cat) (dog) hair and dander: Secondary | ICD-10-CM | POA: Diagnosis not present

## 2019-02-03 DIAGNOSIS — J301 Allergic rhinitis due to pollen: Secondary | ICD-10-CM | POA: Diagnosis not present

## 2019-02-03 DIAGNOSIS — J3089 Other allergic rhinitis: Secondary | ICD-10-CM | POA: Diagnosis not present

## 2019-02-17 DIAGNOSIS — J301 Allergic rhinitis due to pollen: Secondary | ICD-10-CM | POA: Diagnosis not present

## 2019-02-17 DIAGNOSIS — J3081 Allergic rhinitis due to animal (cat) (dog) hair and dander: Secondary | ICD-10-CM | POA: Diagnosis not present

## 2019-02-17 DIAGNOSIS — J3089 Other allergic rhinitis: Secondary | ICD-10-CM | POA: Diagnosis not present

## 2019-02-24 DIAGNOSIS — J301 Allergic rhinitis due to pollen: Secondary | ICD-10-CM | POA: Diagnosis not present

## 2019-02-24 DIAGNOSIS — J3089 Other allergic rhinitis: Secondary | ICD-10-CM | POA: Diagnosis not present

## 2019-02-24 DIAGNOSIS — J3081 Allergic rhinitis due to animal (cat) (dog) hair and dander: Secondary | ICD-10-CM | POA: Diagnosis not present

## 2019-03-03 DIAGNOSIS — J301 Allergic rhinitis due to pollen: Secondary | ICD-10-CM | POA: Diagnosis not present

## 2019-03-03 DIAGNOSIS — J3081 Allergic rhinitis due to animal (cat) (dog) hair and dander: Secondary | ICD-10-CM | POA: Diagnosis not present

## 2019-03-03 DIAGNOSIS — J3089 Other allergic rhinitis: Secondary | ICD-10-CM | POA: Diagnosis not present

## 2019-03-10 DIAGNOSIS — J3081 Allergic rhinitis due to animal (cat) (dog) hair and dander: Secondary | ICD-10-CM | POA: Diagnosis not present

## 2019-03-10 DIAGNOSIS — J301 Allergic rhinitis due to pollen: Secondary | ICD-10-CM | POA: Diagnosis not present

## 2019-03-10 DIAGNOSIS — J3089 Other allergic rhinitis: Secondary | ICD-10-CM | POA: Diagnosis not present

## 2019-03-24 DIAGNOSIS — J3081 Allergic rhinitis due to animal (cat) (dog) hair and dander: Secondary | ICD-10-CM | POA: Diagnosis not present

## 2019-03-24 DIAGNOSIS — J301 Allergic rhinitis due to pollen: Secondary | ICD-10-CM | POA: Diagnosis not present

## 2019-03-24 DIAGNOSIS — J3089 Other allergic rhinitis: Secondary | ICD-10-CM | POA: Diagnosis not present

## 2019-09-17 IMAGING — MG DIGITAL SCREENING BILATERAL MAMMOGRAM WITH TOMO AND CAD
8 series · 8 of 24 positions shown · non-contrast
Comparison: Previous exam(s).

CLINICAL DATA: Screening.

EXAM:
DIGITAL SCREENING BILATERAL MAMMOGRAM WITH TOMO AND CAD

[R CC synth-2D]
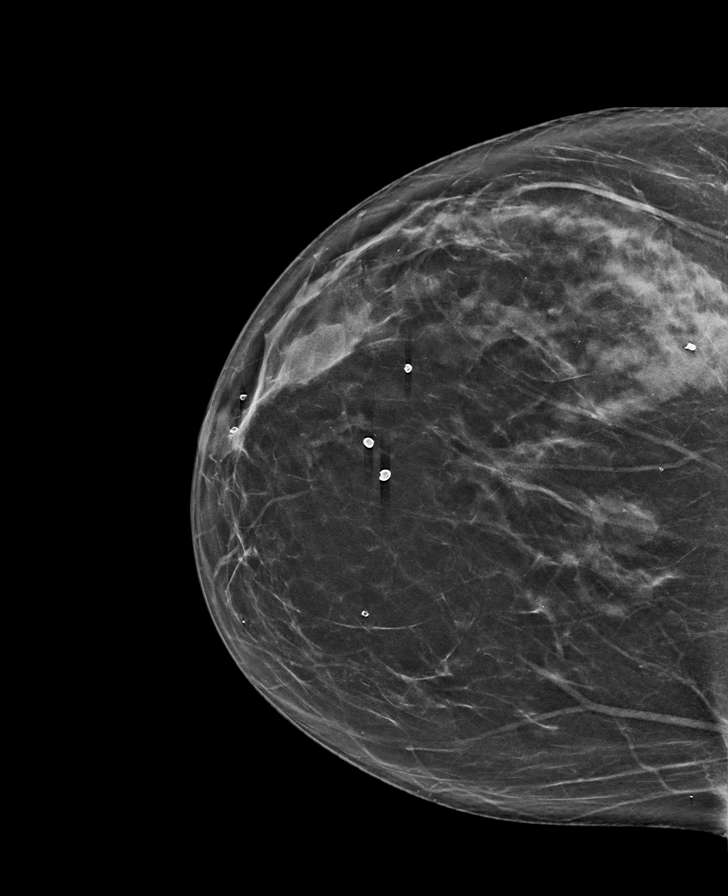

[L CC synth-2D]
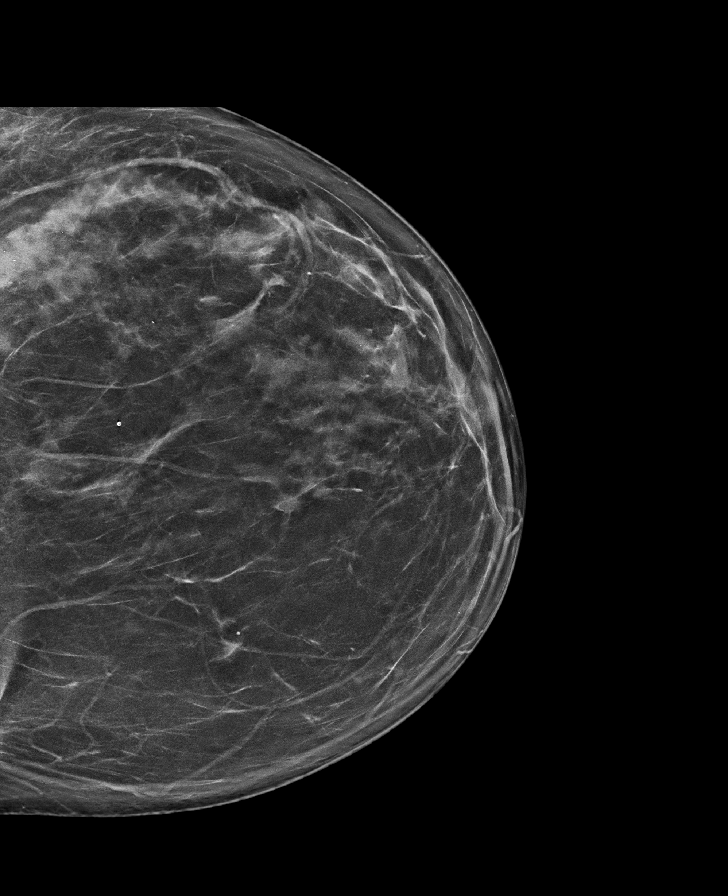

[L MLO synth-2D]
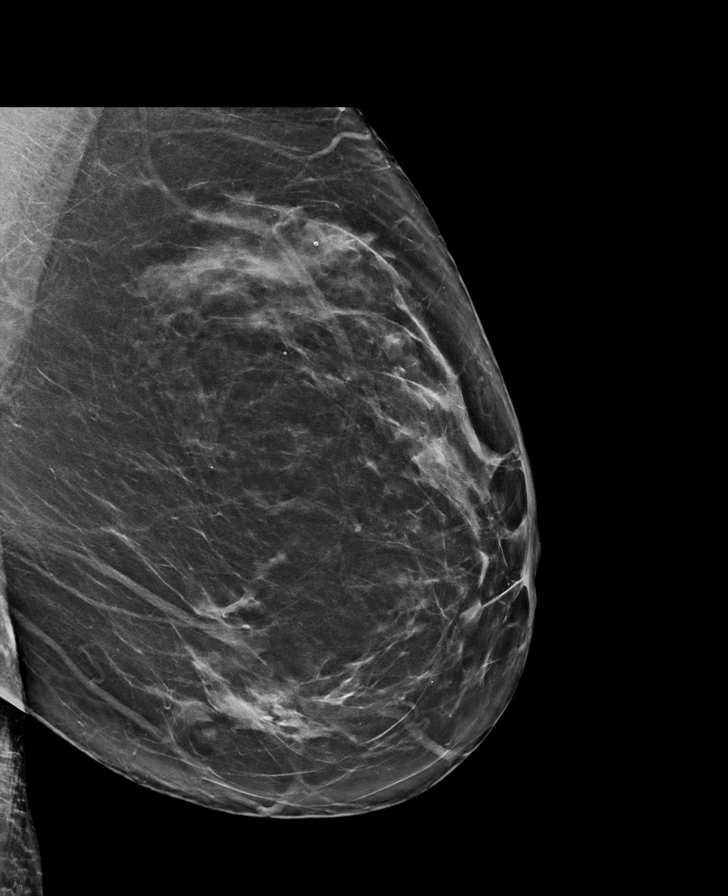

[R MLO synth-2D]
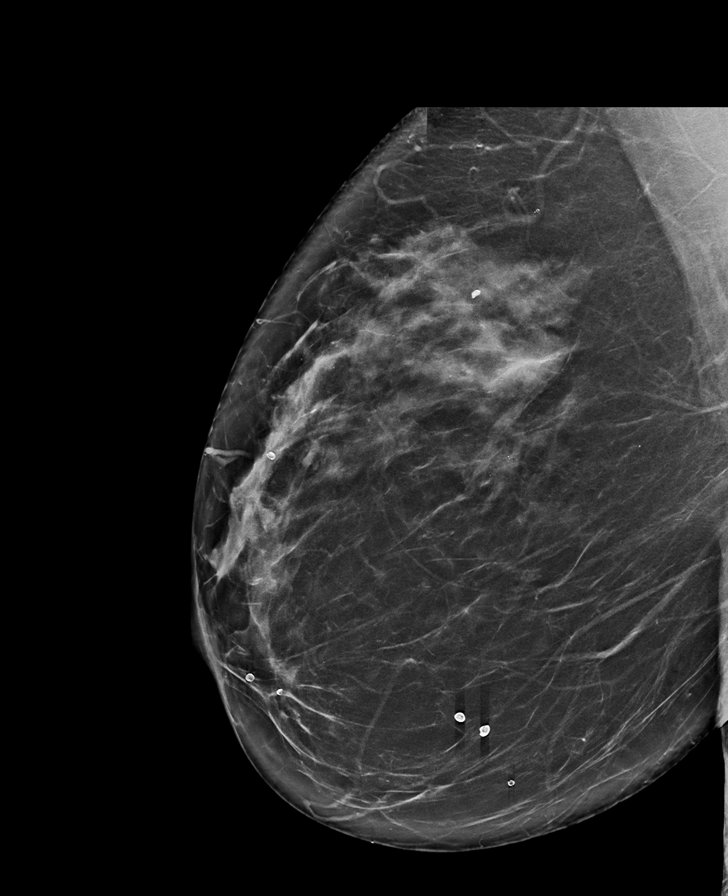

[L CC tomo · tomo slice 39/77.0]
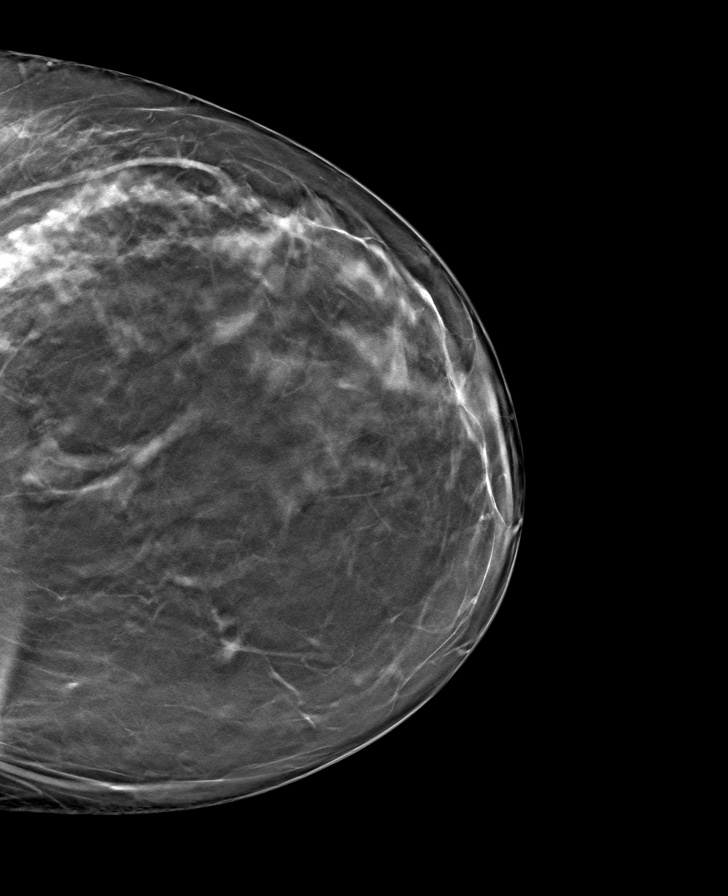

[R CC tomo · tomo slice 38/75.0]
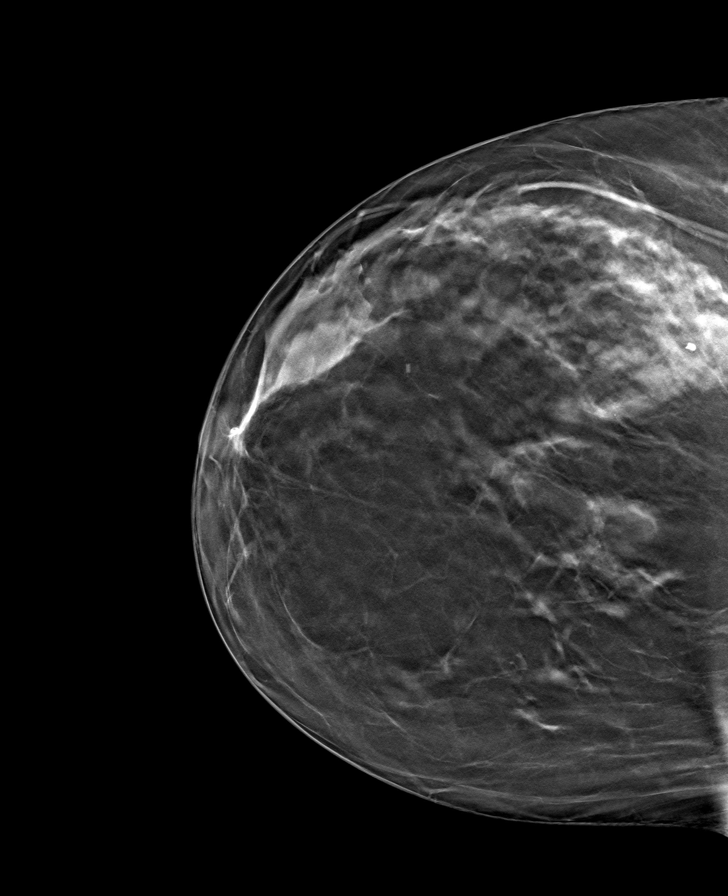

[L MLO tomo · tomo slice 45/90.0]
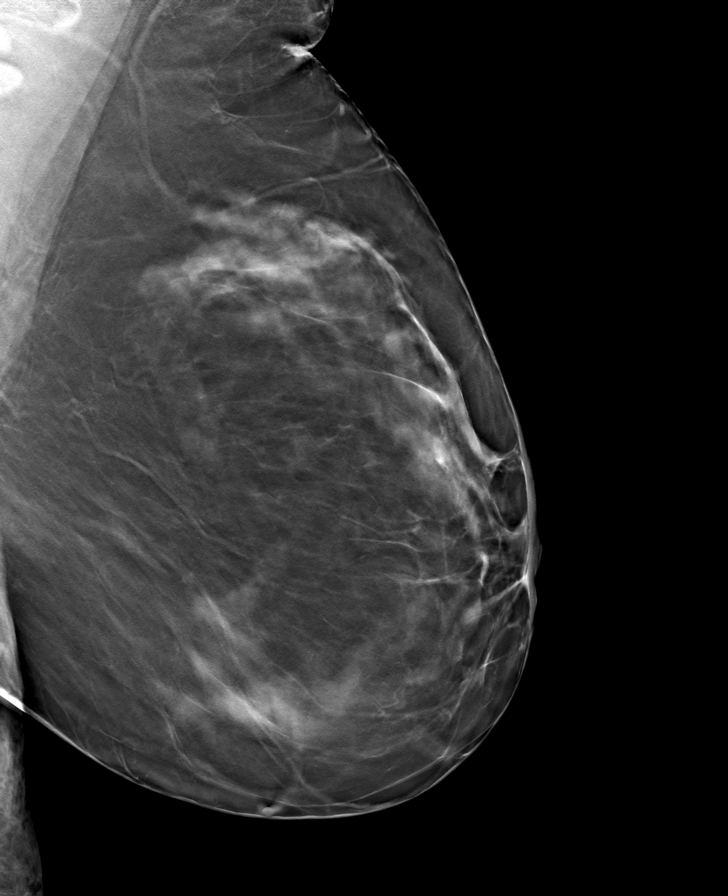

[R MLO tomo · tomo slice 44/87.0]
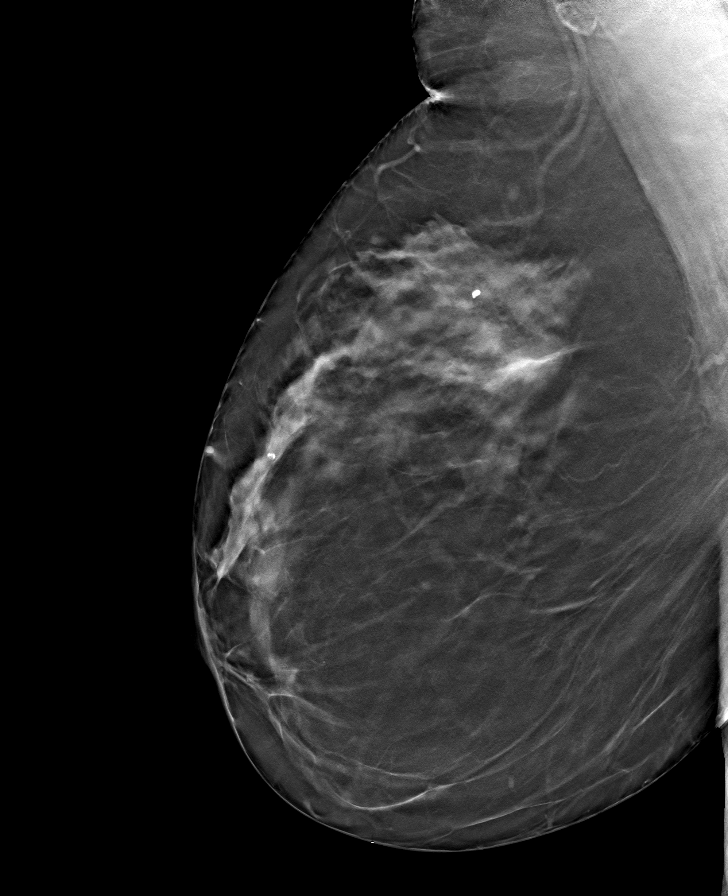

[8 of 24 positions shown; findings below may reference images not displayed]

ACR Breast Density Category c: The breast tissue is heterogeneously
dense, which may obscure small masses.
FINDINGS: There are no findings suspicious for malignancy. Images were
processed with CAD.
IMPRESSION: No mammographic evidence of malignancy. A result letter of this
screening mammogram will be mailed directly to the patient.

RECOMMENDATION:
Screening mammogram in one year. (Code:[id number]-LHB)

BI-RADS CATEGORY  1: Negative.

## 2021-08-01 ENCOUNTER — Ambulatory Visit (HOSPITAL_BASED_OUTPATIENT_CLINIC_OR_DEPARTMENT_OTHER)
Admission: RE | Admit: 2021-08-01 | Discharge: 2021-08-01 | Disposition: A | Discharge status: Home / Self Care | Payer: 59 | Source: Ambulatory Visit | Attending: Family Medicine | Admitting: Family Medicine

## 2021-08-01 ENCOUNTER — Other Ambulatory Visit: Payer: Self-pay

## 2021-08-01 ENCOUNTER — Other Ambulatory Visit (HOSPITAL_BASED_OUTPATIENT_CLINIC_OR_DEPARTMENT_OTHER): Payer: Self-pay | Admitting: Family Medicine

## 2021-08-01 DIAGNOSIS — M79662 Pain in left lower leg: Secondary | ICD-10-CM

## 2022-03-18 ENCOUNTER — Other Ambulatory Visit: Payer: Self-pay | Admitting: Physician Assistant

## 2023-08-08 ENCOUNTER — Emergency Department (HOSPITAL_BASED_OUTPATIENT_CLINIC_OR_DEPARTMENT_OTHER): Payer: 59

## 2023-08-08 ENCOUNTER — Emergency Department (HOSPITAL_BASED_OUTPATIENT_CLINIC_OR_DEPARTMENT_OTHER)
Admission: EM | Admit: 2023-08-08 | Discharge: 2023-08-08 | Disposition: A | Discharge status: Home / Self Care | Payer: 59

## 2023-08-08 ENCOUNTER — Encounter (HOSPITAL_BASED_OUTPATIENT_CLINIC_OR_DEPARTMENT_OTHER): Payer: Self-pay

## 2023-08-08 ENCOUNTER — Other Ambulatory Visit: Payer: Self-pay

## 2023-08-08 DIAGNOSIS — R1013 Epigastric pain: Secondary | ICD-10-CM | POA: Diagnosis present

## 2023-08-08 DIAGNOSIS — N12 Tubulo-interstitial nephritis, not specified as acute or chronic: Secondary | ICD-10-CM | POA: Diagnosis not present

## 2023-08-08 HISTORY — DX: Essential (primary) hypertension: I10

## 2023-08-08 HISTORY — DX: Irritable bowel syndrome, unspecified: K58.9

## 2023-08-08 HISTORY — DX: Gastro-esophageal reflux disease without esophagitis: K21.9

## 2023-08-08 HISTORY — DX: Diaphragmatic hernia without obstruction or gangrene: K44.9

## 2023-08-08 LAB — CBC
HCT: 41.5 % (ref 36.0–46.0)
Hemoglobin: 13.8 g/dL (ref 12.0–15.0)
MCH: 28.3 pg (ref 26.0–34.0)
MCHC: 33.3 g/dL (ref 30.0–36.0)
MCV: 85 fL (ref 80.0–100.0)
Platelets: 275 10*3/uL (ref 150–400)
RBC: 4.88 MIL/uL (ref 3.87–5.11)
RDW: 15 % (ref 11.5–15.5)
WBC: 17.1 10*3/uL — ABNORMAL HIGH (ref 4.0–10.5)
nRBC: 0 % (ref 0.0–0.2)

## 2023-08-08 LAB — URINALYSIS, ROUTINE W REFLEX MICROSCOPIC
Bilirubin Urine: NEGATIVE
Glucose, UA: NEGATIVE mg/dL
Ketones, ur: NEGATIVE mg/dL
Nitrite: NEGATIVE
RBC / HPF: >50 RBC/hpf (ref 0–5)
Specific Gravity, Urine: 1.016 (ref 1.005–1.030)
pH: 7 (ref 5.0–8.0)

## 2023-08-08 LAB — COMPREHENSIVE METABOLIC PANEL
ALT: 41 U/L (ref 0–44)
AST: 26 U/L (ref 15–41)
Albumin: 4 g/dL (ref 3.5–5.0)
Alkaline Phosphatase: 70 U/L (ref 38–126)
Anion gap: 14 (ref 5–15)
BUN: 25 mg/dL — ABNORMAL HIGH (ref 8–23)
CO2: 24 mmol/L (ref 22–32)
Calcium: 9.9 mg/dL (ref 8.9–10.3)
Chloride: 98 mmol/L (ref 98–111)
Creatinine, Ser: 1.11 mg/dL — ABNORMAL HIGH (ref 0.44–1.00)
GFR, Estimated: 56 mL/min — ABNORMAL LOW (ref >60)
Glucose, Bld: 190 mg/dL — ABNORMAL HIGH (ref 70–99)
Potassium: 3.7 mmol/L (ref 3.5–5.1)
Sodium: 136 mmol/L (ref 135–145)
Total Bilirubin: 0.7 mg/dL (ref 0.3–1.2)
Total Protein: 7.1 g/dL (ref 6.5–8.1)

## 2023-08-08 LAB — LACTIC ACID, PLASMA: Lactic Acid, Venous: 1.3 mmol/L (ref 0.5–1.9)

## 2023-08-08 LAB — LIPASE, BLOOD: Lipase: 16 U/L (ref 11–51)

## 2023-08-08 MED ORDER — SODIUM CHLORIDE 0.9 % IV BOLUS
1000.0000 mL | Freq: Once | INTRAVENOUS | Status: AC
  Administered 2023-08-08: 1000 mL via INTRAVENOUS

## 2023-08-08 MED ORDER — IOHEXOL 300 MG/ML  SOLN
100.0000 mL | Freq: Once | INTRAMUSCULAR | Status: AC | PRN
  Administered 2023-08-08: 100 mL via INTRAVENOUS

## 2023-08-08 MED ORDER — DIAZEPAM 5 MG PO TABS
2.5000 mg | ORAL_TABLET | Freq: Once | ORAL | Status: AC
  Administered 2023-08-08: 2.5 mg via ORAL
  Filled 2023-08-08: qty 1

## 2023-08-08 MED ORDER — ONDANSETRON HCL 4 MG PO TABS: 4.0000 mg | ORAL_TABLET | Freq: Four times a day (QID) | ORAL | 0 refills | Status: AC

## 2023-08-08 MED ORDER — ONDANSETRON HCL 4 MG/2ML IJ SOLN
4.0000 mg | Freq: Once | INTRAMUSCULAR | Status: AC
  Administered 2023-08-08: 4 mg via INTRAVENOUS
  Filled 2023-08-08: qty 2

## 2023-08-08 MED ORDER — LEVOFLOXACIN 250 MG PO TABS: 750.0000 mg | ORAL_TABLET | Freq: Every day | ORAL | 0 refills | Status: AC

## 2023-08-08 MED ORDER — ALUM & MAG HYDROXIDE-SIMETH 200-200-20 MG/5ML PO SUSP
30.0000 mL | Freq: Once | ORAL | Status: AC
  Administered 2023-08-08: 30 mL via ORAL
  Filled 2023-08-08: qty 30

## 2023-08-08 MED ORDER — DICYCLOMINE HCL 10 MG PO CAPS
10.0000 mg | ORAL_CAPSULE | Freq: Once | ORAL | Status: AC
  Administered 2023-08-08: 10 mg via ORAL
  Filled 2023-08-08: qty 1

## 2023-08-08 NOTE — ED Provider Notes (Signed)
[place] EMERGENCY DEPARTMENT AT [facility] Provider Note   CSN: [id number] Arrival date & time: 08/08/23  1627     History  Chief Complaint  Patient presents with   Abdominal Pain    [name] is a 67 y.o. female.  67 year old female to the emergency department with epigastric and left-sided abdominal pain after taking azithromycin.  Recent URI and was prescribed Z-Pak for possible sinusitis.  She reports she took azithromycin on empty stomach developed some epigastric and left-sided abdominal pain described as crampy.  Some nausea no vomiting.   Abdominal Pain      Home Medications Prior to Admission medications   Medication Sig Start Date End Date Taking? Authorizing Provider  levofloxacin (LEVAQUIN) 250 MG tablet Take 3 tablets (750 mg total) by mouth daily for 7 days. 08/08/23 08/15/23 Yes Geraldy Akridge J, DO  ondansetron (ZOFRAN) 4 MG tablet Take 1 tablet (4 mg total) by mouth every 6 (six) hours for 3 days. 08/08/23 08/11/23 Yes Coral Spikes, DO      Allergies    Bactrim [sulfamethoxazole-trimethoprim] and Penicillins    Review of Systems   Review of Systems  Gastrointestinal:  Positive for abdominal pain.    Physical Exam Updated Vital Signs BP (!) 154/82   Pulse 92   Temp (!) 97.1 F (36.2 C) (Oral)   Resp 18   Ht 5\' 2"  (1.575 m)   Wt 113.4 kg   SpO2 96%   BMI 45.73 kg/m  Physical Exam Vitals and nursing note reviewed.  Constitutional:      General: She is not in acute distress.    Appearance: She is not toxic-appearing.  HENT:     Head: Normocephalic.  Cardiovascular:     Rate and Rhythm: Normal rate and regular rhythm.  Pulmonary:     Effort: Pulmonary effort is normal.  Abdominal:     Palpations: Abdomen is soft.     Tenderness: There is abdominal tenderness (mild) in the epigastric area, periumbilical area, suprapubic area, left upper quadrant and left lower quadrant. There is no right CVA tenderness, left CVA tenderness,  guarding or rebound.     Hernia: No hernia is present.  Neurological:     General: No focal deficit present.     Mental Status: She is alert.  Psychiatric:        Mood and Affect: Mood normal.        Behavior: Behavior normal.     ED Results / Procedures / Treatments   Labs (all labs ordered are listed, but only abnormal results are displayed) Labs Reviewed  COMPREHENSIVE METABOLIC PANEL - Abnormal; Notable for the following components:      Result Value   Glucose, Bld 190 (*)    BUN 25 (*)    Creatinine, Ser 1.11 (*)    GFR, Estimated 56 (*)    All other components within normal limits  CBC - Abnormal; Notable for the following components:   WBC 17.1 (*)    All other components within normal limits  URINALYSIS, ROUTINE W REFLEX MICROSCOPIC - Abnormal; Notable for the following components:   Hgb urine dipstick LARGE (*)    Protein, ur TRACE (*)    Leukocytes,Ua MODERATE (*)    Bacteria, UA MANY (*)    All other components within normal limits  LIPASE, BLOOD  LACTIC ACID, PLASMA    EKG None  Radiology CT ABDOMEN PELVIS W CONTRAST  Result Date: 08/08/2023 CLINICAL DATA:  Diverticulitis, complication suspected  EXAM: CT ABDOMEN AND PELVIS WITH CONTRAST TECHNIQUE: Multidetector CT imaging of the abdomen and pelvis was performed using the standard protocol following bolus administration of intravenous contrast. RADIATION DOSE REDUCTION: This exam was performed according to the departmental dose-optimization program which includes automated exposure control, adjustment of the mA and/or kV according to patient size and/or use of iterative reconstruction technique. CONTRAST:  OMNIPAQUE IOHEXOL 300 MG/ML  SOLN COMPARISON:  None Available. FINDINGS: Lower chest: At least small volume hiatal hernia. Hepatobiliary: The hepatic parenchyma is diffusely hypodense compared to the splenic parenchyma consistent with fatty infiltration. No focal liver abnormality. No gallstones, gallbladder  wall thickening, or pericholecystic fluid. No biliary dilatation. Pancreas: No focal lesion. Normal pancreatic contour. No surrounding inflammatory changes. No main pancreatic ductal dilatation. Spleen: Normal in size without focal abnormality. Adrenals/Urinary Tract: No adrenal nodule bilaterally. Asymmetric left perinephric fat stranding. Bilateral kidneys enhance symmetrically. Left urothelial thickening. No hydronephrosis. No hydroureter. No right nephroureterolithiasis. Punctate left nephrolithiasis. No left ureterolithiasis. The urinary bladder is unremarkable. On delayed imaging, there is no urothelial wall thickening and there are no filling defects in the opacified portions of the bilateral collecting systems or ureters. Stomach/Bowel: Stomach is within normal limits. No evidence of bowel wall thickening or dilatation. Colonic diverticulosis. Appendix appears normal. Vascular/Lymphatic: No abdominal aorta or iliac aneurysm. Mild atherosclerotic plaque of the aorta and its branches. No abdominal, pelvic, or inguinal lymphadenopathy. Reproductive: Uterus and bilateral adnexa are unremarkable. Other: No intraperitoneal free fluid. No intraperitoneal free gas. No organized fluid collection. Musculoskeletal: Tiny fat containing umbilical hernia. No suspicious lytic or blastic osseous lesions. No acute displaced fracture. Multilevel degenerative changes of the spine. IMPRESSION: 1. Left urothelial thickening-correlate with urinalysis for infection. 2. Nonobstructive punctate left nephrolithiasis. 3. Colonic diverticulosis with no acute diverticulitis. 4. Small to moderate volume hiatal hernia. 5.  Aortic Atherosclerosis (ICD10-I70.0). 6. Hepatic steatosis Electronically Signed   By: Tish Frederickson M.D.   On: 08/08/2023 20:10    Procedures Procedures    Medications Ordered in ED Medications  diazepam (VALIUM) tablet 2.5 mg (2.5 mg Oral Given 08/08/23 1825)  dicyclomine (BENTYL) capsule 10 mg (10 mg Oral  Given 08/08/23 1826)  sodium chloride 0.9 % bolus 1,000 mL (0 mLs Intravenous Stopped 08/08/23 1949)  ondansetron (ZOFRAN) injection 4 mg (4 mg Intravenous Given 08/08/23 1957)  alum & mag hydroxide-simeth (MAALOX/MYLANTA) 200-200-20 MG/5ML suspension 30 mL (30 mLs Oral Given 08/08/23 1957)  iohexol (OMNIPAQUE) 300 MG/ML solution 100 mL (100 mLs Intravenous Contrast Given 08/08/23 1939)    ED Course/ Medical Decision Making/ A&P Clinical Course as of 08/08/23 2357  Fri Aug 08, 2023  2029 CT ABDOMEN PELVIS W CONTRAST IMPRESSION: 1. Left urothelial thickening-correlate with urinalysis for infection. 2. Nonobstructive punctate left nephrolithiasis. 3. Colonic diverticulosis with no acute diverticulitis. 4. Small to moderate volume hiatal hernia. 5.  Aortic Atherosclerosis (ICD10-I70.0). 6. Hepatic steatosis   [TY]    Clinical Course User Index [TY] Coral Spikes, DO                                 Medical Decision Making Well-appearing 66 year old female to the emergency department for left-sided abdominal pain after taking azithromycin.  She is afebrile nontachycardic normotensive.  Physical exam reassuring with soft nontender abdomen.  Workup today with no significant metabolic derangements.  Kidney function appears to be relatively close to baseline.  No transaminitis suggest PAD biliary disease.  Lactate normal;  mesenteric ischemia unlikely.  Lipase normal.  Pancreatitis unlikely.  Does have leukocytosis.  Also appears to have urinary tract infection.  CT scan given age and comorbid risk factors.  CT with signs of infection to the left ureter kidney consistent with probable Pilo given her UA and symptoms today.  Patient is tolerating p.o. and feeling much better after antiemetics and pain controlled.  Will discharge with antibiotics.  Follow-up PCP.  Amount and/or Complexity of Data Reviewed Labs: ordered. Radiology: ordered. Decision-making details documented in ED Course.  Risk OTC  drugs. Prescription drug management.          Final Clinical Impression(s) / ED Diagnoses Final diagnoses:  Pyelonephritis    Rx / DC Orders ED Discharge Orders          Ordered    levofloxacin (LEVAQUIN) 250 MG tablet  Daily        08/08/23 2136    ondansetron (ZOFRAN) 4 MG tablet  Every 6 hours        08/08/23 2137              Coral Spikes, DO 08/08/23 2357

## 2023-08-08 NOTE — Discharge Instructions (Addendum)
Take antibiotics we have prescribed. Follow up with your primary doctor

## 2023-08-08 NOTE — ED Triage Notes (Signed)
Pt presents to triage with c/o having a sensitive stomach since 1230 or so. Taking erythromycin for "allergic reaction". Reports she took medicine with food but is having stomach pain now. Reports throwing up phlegm. Reports spasm of stomach.

## 2023-10-17 ENCOUNTER — Other Ambulatory Visit: Payer: Self-pay | Admitting: Family Medicine

## 2023-10-17 DIAGNOSIS — Z1231 Encounter for screening mammogram for malignant neoplasm of breast: Secondary | ICD-10-CM

## 2023-11-14 ENCOUNTER — Ambulatory Visit
Admission: RE | Admit: 2023-11-14 | Discharge: 2023-11-14 | Disposition: A | Discharge status: Home / Self Care | Payer: 59 | Source: Ambulatory Visit | Attending: Family Medicine | Admitting: Family Medicine

## 2023-11-14 DIAGNOSIS — Z1231 Encounter for screening mammogram for malignant neoplasm of breast: Secondary | ICD-10-CM
